# Patient Record
Sex: Male | Born: 1960 | Race: Black or African American | Hispanic: No | Marital: Married | Smoking: Former smoker
Health system: Southern US, Community
[De-identification: ages and names within clinical notes are randomized; demographics above are authoritative.]

## PROBLEM LIST (undated history)

## (undated) DIAGNOSIS — R972 Elevated prostate specific antigen [PSA]: Secondary | ICD-10-CM

## (undated) DIAGNOSIS — N5201 Erectile dysfunction due to arterial insufficiency: Secondary | ICD-10-CM

## (undated) DIAGNOSIS — M7581 Other shoulder lesions, right shoulder: Secondary | ICD-10-CM

## (undated) DIAGNOSIS — E7800 Pure hypercholesterolemia, unspecified: Secondary | ICD-10-CM

## (undated) DIAGNOSIS — M1990 Unspecified osteoarthritis, unspecified site: Secondary | ICD-10-CM

## (undated) DIAGNOSIS — M2042 Other hammer toe(s) (acquired), left foot: Secondary | ICD-10-CM

## (undated) DIAGNOSIS — Z87442 Personal history of urinary calculi: Secondary | ICD-10-CM

## (undated) DIAGNOSIS — I10 Essential (primary) hypertension: Secondary | ICD-10-CM

## (undated) DIAGNOSIS — R29898 Other symptoms and signs involving the musculoskeletal system: Secondary | ICD-10-CM

## (undated) DIAGNOSIS — M2012 Hallux valgus (acquired), left foot: Secondary | ICD-10-CM

## (undated) HISTORY — PX: HERNIA REPAIR: SHX51

---

## 2006-01-03 ENCOUNTER — Ambulatory Visit: Payer: Self-pay

## 2006-02-07 ENCOUNTER — Ambulatory Visit: Payer: Self-pay | Admitting: Pain Medicine

## 2006-02-13 ENCOUNTER — Ambulatory Visit: Payer: Self-pay | Admitting: Pain Medicine

## 2006-03-23 ENCOUNTER — Ambulatory Visit: Payer: Self-pay | Admitting: Pain Medicine

## 2006-03-27 ENCOUNTER — Ambulatory Visit: Payer: Self-pay | Admitting: Pain Medicine

## 2006-04-15 ENCOUNTER — Ambulatory Visit: Payer: Self-pay | Admitting: Pain Medicine

## 2006-05-01 ENCOUNTER — Ambulatory Visit: Payer: Self-pay | Admitting: Pain Medicine

## 2006-06-08 ENCOUNTER — Ambulatory Visit: Payer: Self-pay | Admitting: Pain Medicine

## 2006-06-12 ENCOUNTER — Ambulatory Visit: Payer: Self-pay | Admitting: Pain Medicine

## 2006-07-27 ENCOUNTER — Ambulatory Visit: Payer: Self-pay | Admitting: Pain Medicine

## 2006-07-31 ENCOUNTER — Ambulatory Visit: Payer: Self-pay | Admitting: Pain Medicine

## 2006-09-07 ENCOUNTER — Ambulatory Visit: Payer: Self-pay | Admitting: Pain Medicine

## 2006-12-12 ENCOUNTER — Ambulatory Visit: Payer: Self-pay | Admitting: Pain Medicine

## 2007-03-20 ENCOUNTER — Ambulatory Visit: Payer: Self-pay | Admitting: Pain Medicine

## 2007-03-26 ENCOUNTER — Ambulatory Visit: Payer: Self-pay | Admitting: Pain Medicine

## 2007-04-10 ENCOUNTER — Ambulatory Visit: Payer: Self-pay | Admitting: Pain Medicine

## 2007-05-31 ENCOUNTER — Ambulatory Visit: Payer: Self-pay | Admitting: Pain Medicine

## 2007-06-26 ENCOUNTER — Ambulatory Visit: Payer: Self-pay | Admitting: Pain Medicine

## 2007-08-24 ENCOUNTER — Ambulatory Visit: Payer: Self-pay | Admitting: Pain Medicine

## 2007-10-18 ENCOUNTER — Ambulatory Visit: Payer: Self-pay | Admitting: Pain Medicine

## 2008-01-08 ENCOUNTER — Ambulatory Visit: Payer: Self-pay | Admitting: Pain Medicine

## 2008-04-01 ENCOUNTER — Ambulatory Visit: Payer: Self-pay | Admitting: Pain Medicine

## 2008-06-24 ENCOUNTER — Ambulatory Visit: Payer: Self-pay | Admitting: Pain Medicine

## 2008-07-29 ENCOUNTER — Ambulatory Visit: Payer: Self-pay | Admitting: Surgery

## 2008-08-04 ENCOUNTER — Ambulatory Visit: Payer: Self-pay | Admitting: Surgery

## 2008-08-14 ENCOUNTER — Ambulatory Visit: Payer: Self-pay | Admitting: Pain Medicine

## 2011-10-10 ENCOUNTER — Ambulatory Visit: Payer: Self-pay | Admitting: Gastroenterology

## 2012-07-10 DIAGNOSIS — N5201 Erectile dysfunction due to arterial insufficiency: Secondary | ICD-10-CM | POA: Insufficient documentation

## 2012-07-10 DIAGNOSIS — N400 Enlarged prostate without lower urinary tract symptoms: Secondary | ICD-10-CM | POA: Insufficient documentation

## 2012-07-10 DIAGNOSIS — Z79899 Other long term (current) drug therapy: Secondary | ICD-10-CM | POA: Insufficient documentation

## 2012-09-18 ENCOUNTER — Ambulatory Visit: Payer: Self-pay | Admitting: Gastroenterology

## 2012-10-08 ENCOUNTER — Ambulatory Visit: Payer: Self-pay | Admitting: Gastroenterology

## 2013-03-29 ENCOUNTER — Ambulatory Visit: Payer: Self-pay

## 2015-06-12 DIAGNOSIS — R739 Hyperglycemia, unspecified: Secondary | ICD-10-CM | POA: Insufficient documentation

## 2015-06-12 DIAGNOSIS — H8110 Benign paroxysmal vertigo, unspecified ear: Secondary | ICD-10-CM | POA: Insufficient documentation

## 2016-08-30 DIAGNOSIS — M25511 Pain in right shoulder: Secondary | ICD-10-CM | POA: Insufficient documentation

## 2017-04-19 DIAGNOSIS — M25511 Pain in right shoulder: Secondary | ICD-10-CM | POA: Diagnosis not present

## 2017-04-19 DIAGNOSIS — G8929 Other chronic pain: Secondary | ICD-10-CM | POA: Diagnosis not present

## 2017-04-19 DIAGNOSIS — M7581 Other shoulder lesions, right shoulder: Secondary | ICD-10-CM | POA: Diagnosis not present

## 2017-04-20 ENCOUNTER — Other Ambulatory Visit: Payer: Self-pay | Admitting: Student

## 2017-04-20 DIAGNOSIS — M25511 Pain in right shoulder: Secondary | ICD-10-CM

## 2017-04-20 DIAGNOSIS — M7581 Other shoulder lesions, right shoulder: Secondary | ICD-10-CM

## 2017-04-20 DIAGNOSIS — G8929 Other chronic pain: Secondary | ICD-10-CM

## 2017-06-12 DIAGNOSIS — N400 Enlarged prostate without lower urinary tract symptoms: Secondary | ICD-10-CM | POA: Diagnosis not present

## 2017-06-12 DIAGNOSIS — E291 Testicular hypofunction: Secondary | ICD-10-CM | POA: Diagnosis not present

## 2017-06-23 DIAGNOSIS — Z7689 Persons encountering health services in other specified circumstances: Secondary | ICD-10-CM | POA: Diagnosis not present

## 2017-07-03 DIAGNOSIS — M25511 Pain in right shoulder: Secondary | ICD-10-CM | POA: Diagnosis not present

## 2017-07-03 DIAGNOSIS — Z0000 Encounter for general adult medical examination without abnormal findings: Secondary | ICD-10-CM | POA: Diagnosis not present

## 2017-07-03 DIAGNOSIS — I10 Essential (primary) hypertension: Secondary | ICD-10-CM | POA: Diagnosis not present

## 2017-09-25 DIAGNOSIS — R350 Frequency of micturition: Secondary | ICD-10-CM | POA: Diagnosis not present

## 2017-09-25 DIAGNOSIS — N342 Other urethritis: Secondary | ICD-10-CM | POA: Diagnosis not present

## 2017-10-27 DIAGNOSIS — G8929 Other chronic pain: Secondary | ICD-10-CM | POA: Diagnosis not present

## 2017-10-27 DIAGNOSIS — M7581 Other shoulder lesions, right shoulder: Secondary | ICD-10-CM | POA: Diagnosis not present

## 2017-10-27 DIAGNOSIS — M25511 Pain in right shoulder: Secondary | ICD-10-CM | POA: Diagnosis not present

## 2017-10-31 ENCOUNTER — Other Ambulatory Visit: Payer: Self-pay | Admitting: Student

## 2017-10-31 DIAGNOSIS — G8929 Other chronic pain: Secondary | ICD-10-CM

## 2017-10-31 DIAGNOSIS — M25511 Pain in right shoulder: Secondary | ICD-10-CM

## 2017-10-31 DIAGNOSIS — M7581 Other shoulder lesions, right shoulder: Secondary | ICD-10-CM

## 2017-11-11 ENCOUNTER — Ambulatory Visit
Admission: RE | Admit: 2017-11-11 | Discharge: 2017-11-11 | Disposition: A | Discharge status: Home / Self Care | Payer: 59 | Source: Ambulatory Visit | Attending: Student | Admitting: Student

## 2017-11-11 DIAGNOSIS — M75121 Complete rotator cuff tear or rupture of right shoulder, not specified as traumatic: Secondary | ICD-10-CM | POA: Insufficient documentation

## 2017-11-11 DIAGNOSIS — G8929 Other chronic pain: Secondary | ICD-10-CM | POA: Diagnosis not present

## 2017-11-11 DIAGNOSIS — M25511 Pain in right shoulder: Secondary | ICD-10-CM | POA: Diagnosis present

## 2017-11-11 DIAGNOSIS — M25411 Effusion, right shoulder: Secondary | ICD-10-CM | POA: Diagnosis not present

## 2017-11-11 DIAGNOSIS — M7581 Other shoulder lesions, right shoulder: Secondary | ICD-10-CM | POA: Diagnosis not present

## 2017-12-04 DIAGNOSIS — M75121 Complete rotator cuff tear or rupture of right shoulder, not specified as traumatic: Secondary | ICD-10-CM | POA: Insufficient documentation

## 2017-12-04 DIAGNOSIS — M7581 Other shoulder lesions, right shoulder: Secondary | ICD-10-CM | POA: Diagnosis not present

## 2017-12-11 ENCOUNTER — Inpatient Hospital Stay: Admission: RE | Admit: 2017-12-11 | Payer: 59 | Source: Ambulatory Visit

## 2017-12-13 ENCOUNTER — Encounter
Admission: RE | Admit: 2017-12-13 | Discharge: 2017-12-13 | Disposition: A | Discharge status: Home / Self Care | Payer: 59 | Source: Ambulatory Visit | Attending: Surgery | Admitting: Surgery

## 2017-12-13 ENCOUNTER — Other Ambulatory Visit: Payer: Self-pay

## 2017-12-13 HISTORY — DX: Unspecified osteoarthritis, unspecified site: M19.90

## 2017-12-13 NOTE — Patient Instructions (Signed)
  Your procedure is scheduled on: 12-19-17 Report to Same Day Surgery 2nd floor medical mall [facility] Entrance-take elevator on left to 2nd floor.  Check in with surgery information desk.) To find out your arrival time please call [phone] between 1PM - 3PM on 12-18-17  Remember: Instructions that are not followed completely may result in serious medical risk, up to and including death, or upon the discretion of your surgeon and anesthesiologist your surgery may need to be rescheduled.    _x___ 1. Do not eat food after midnight the night before your procedure. NO GUM OR CANDY AFTER MIDNIGHT.  You may drink clear liquids up to 2 hours before you are scheduled to arrive at the hospital for your procedure.  Do not drink clear liquids within 2 hours of your scheduled arrival to the hospital.  Clear liquids include  --Water or Apple juice without pulp  --Clear carbohydrate beverage such as ClearFast or Gatorade  --Black Coffee or Clear Tea (No milk, no creamers, do not add anything to  the coffee or Tea    __x__ 2. No Alcohol for 24 hours before or after surgery.   __x__3. No Smoking for 24 prior to surgery.   ____  4. Bring all medications with you on the day of surgery if instructed.    __x__ 5. Notify your doctor if there is any change in your medical condition     (cold, fever, infections).     Do not wear jewelry, make-up, hairpins, clips or nail polish.  Do not wear lotions, powders, or perfumes. You may wear deodorant.  Do not shave 48 hours prior to surgery. Men may shave face and neck.  Do not bring valuables to the hospital.    [facility] is not responsible for any belongings or valuables.               Contacts, dentures or bridgework may not be worn into surgery.  Leave your suitcase in the car. After surgery it may be brought to your room.  For patients admitted to the hospital, discharge time is determined by your treatment team.   Patients discharged the day of  surgery will not be allowed to drive home.  You will need someone to drive you home and stay with you the night of your procedure.    Please read over the following fact sheets that you were given:   [facility] Preparing for Surgery and or MRSA Information   _x___ TAKE THE FOLLOWING MEDICATIONS THE MORNING OF SURGERY WITH A SMALL SIP OF WATER.    NONE   ____Fleets enema or Magnesium Citrate as directed.   ____ Use CHG Soap or sage wipes as directed on instruction sheet   ____ Use inhalers on the day of surgery and bring to hospital day of surgery  ____ Stop Metformin and Janumet 2 days prior to surgery.    ____ Take 1/2 of usual insulin dose the night before surgery and none on the morning  surgery.   ____ Follow recommendations from Cardiologist, Pulmonologist or PCP regarding stopping Aspirin, Coumadin, Plavix ,Eliquis, Effient, or Pradaxa, and Pletal.  X____Stop Anti-inflammatories such as Advil, Aleve, IBUPROFEN PM, Motrin, Naproxen, Naprosyn, Goodies powders or aspirin products NOW-OK to take Tylenol    _x___ Stop supplements until after surgery-STOP FISH OIL AND VITAMIN E NOW-MAY RESUME AFTER SURGERY   ____ Bring C-Pap to the hospital.

## 2017-12-18 MED ORDER — CEFAZOLIN SODIUM-DEXTROSE 2-4 GM/100ML-% IV SOLN
2.0000 g | Freq: Once | INTRAVENOUS | Status: AC
  Administered 2017-12-19: 2 g via INTRAVENOUS

## 2017-12-19 ENCOUNTER — Ambulatory Visit
Admission: RE | Admit: 2017-12-19 | Discharge: 2017-12-19 | Disposition: A | Discharge status: Home / Self Care | Payer: 59 | Source: Ambulatory Visit | Attending: Surgery | Admitting: Surgery

## 2017-12-19 ENCOUNTER — Ambulatory Visit: Payer: 59 | Admitting: Anesthesiology

## 2017-12-19 ENCOUNTER — Other Ambulatory Visit: Payer: Self-pay

## 2017-12-19 ENCOUNTER — Encounter
Admission: RE | Disposition: A | Discharge status: Home / Self Care | Payer: Self-pay | Source: Ambulatory Visit | Attending: Surgery

## 2017-12-19 DIAGNOSIS — I10 Essential (primary) hypertension: Secondary | ICD-10-CM | POA: Diagnosis not present

## 2017-12-19 DIAGNOSIS — Z803 Family history of malignant neoplasm of breast: Secondary | ICD-10-CM | POA: Diagnosis not present

## 2017-12-19 DIAGNOSIS — E785 Hyperlipidemia, unspecified: Secondary | ICD-10-CM | POA: Diagnosis not present

## 2017-12-19 DIAGNOSIS — Z823 Family history of stroke: Secondary | ICD-10-CM | POA: Diagnosis not present

## 2017-12-19 DIAGNOSIS — M75101 Unspecified rotator cuff tear or rupture of right shoulder, not specified as traumatic: Secondary | ICD-10-CM | POA: Insufficient documentation

## 2017-12-19 DIAGNOSIS — M7541 Impingement syndrome of right shoulder: Secondary | ICD-10-CM | POA: Insufficient documentation

## 2017-12-19 DIAGNOSIS — M75121 Complete rotator cuff tear or rupture of right shoulder, not specified as traumatic: Secondary | ICD-10-CM | POA: Diagnosis not present

## 2017-12-19 DIAGNOSIS — M25511 Pain in right shoulder: Secondary | ICD-10-CM | POA: Diagnosis not present

## 2017-12-19 DIAGNOSIS — M7581 Other shoulder lesions, right shoulder: Secondary | ICD-10-CM | POA: Diagnosis not present

## 2017-12-19 DIAGNOSIS — Z87891 Personal history of nicotine dependence: Secondary | ICD-10-CM | POA: Diagnosis not present

## 2017-12-19 DIAGNOSIS — G8918 Other acute postprocedural pain: Secondary | ICD-10-CM | POA: Diagnosis not present

## 2017-12-19 HISTORY — PX: SHOULDER ARTHROSCOPY WITH OPEN ROTATOR CUFF REPAIR: SHX6092

## 2017-12-19 SURGERY — ARTHROSCOPY, SHOULDER WITH REPAIR, ROTATOR CUFF, OPEN
Anesthesia: General | Site: Shoulder | Laterality: Right

## 2017-12-19 MED ORDER — FAMOTIDINE 20 MG PO TABS
ORAL_TABLET | ORAL | Status: AC
  Administered 2017-12-19: 20 mg via ORAL
  Filled 2017-12-19: qty 1

## 2017-12-19 MED ORDER — ONDANSETRON HCL 4 MG/2ML IJ SOLN: 4.0000 mg | Freq: Once | INTRAMUSCULAR | Status: DC | PRN

## 2017-12-19 MED ORDER — FENTANYL CITRATE (PF) 100 MCG/2ML IJ SOLN
INTRAMUSCULAR | Status: DC | PRN
  Administered 2017-12-19: 50 ug via INTRAVENOUS

## 2017-12-19 MED ORDER — BUPIVACAINE LIPOSOME 1.3 % IJ SUSP
INTRAMUSCULAR | Status: AC
  Filled 2017-12-19: qty 20

## 2017-12-19 MED ORDER — LACTATED RINGERS IV SOLN
INTRAVENOUS | Status: DC
Start: 2017-12-19 — End: 2017-12-20
  Administered 2017-12-19: 11:00:00 via INTRAVENOUS

## 2017-12-19 MED ORDER — LIDOCAINE HCL (CARDIAC) 20 MG/ML IV SOLN
INTRAVENOUS | Status: DC | PRN
  Administered 2017-12-19: 100 mg via INTRAVENOUS

## 2017-12-19 MED ORDER — ROCURONIUM BROMIDE 100 MG/10ML IV SOLN
INTRAVENOUS | Status: DC | PRN
  Administered 2017-12-19: 80 mg via INTRAVENOUS

## 2017-12-19 MED ORDER — SUGAMMADEX SODIUM 200 MG/2ML IV SOLN
INTRAVENOUS | Status: DC | PRN
  Administered 2017-12-19: 170 mg via INTRAVENOUS

## 2017-12-19 MED ORDER — BUPIVACAINE HCL (PF) 0.5 % IJ SOLN
INTRAMUSCULAR | Status: AC
  Filled 2017-12-19: qty 10

## 2017-12-19 MED ORDER — LIDOCAINE HCL (PF) 2 % IJ SOLN
INTRAMUSCULAR | Status: AC
  Filled 2017-12-19: qty 10

## 2017-12-19 MED ORDER — FENTANYL CITRATE (PF) 100 MCG/2ML IJ SOLN: 25.0000 ug | INTRAMUSCULAR | Status: DC | PRN

## 2017-12-19 MED ORDER — MIDAZOLAM HCL 2 MG/2ML IJ SOLN
2.0000 mg | Freq: Once | INTRAMUSCULAR | Status: AC
  Administered 2017-12-19: 2 mg via INTRAVENOUS

## 2017-12-19 MED ORDER — OXYCODONE HCL 5 MG PO TABS: 5.0000 mg | ORAL_TABLET | ORAL | 0 refills | Status: DC | PRN

## 2017-12-19 MED ORDER — ROCURONIUM BROMIDE 50 MG/5ML IV SOLN
INTRAVENOUS | Status: AC
  Filled 2017-12-19: qty 2

## 2017-12-19 MED ORDER — DEXAMETHASONE SODIUM PHOSPHATE 4 MG/ML IJ SOLN
INTRAMUSCULAR | Status: DC | PRN
  Administered 2017-12-19: 6 mg via INTRAVENOUS

## 2017-12-19 MED ORDER — PROPOFOL 10 MG/ML IV BOLUS
INTRAVENOUS | Status: DC | PRN
  Administered 2017-12-19: 100 mg via INTRAVENOUS
  Administered 2017-12-19: 200 mg via INTRAVENOUS

## 2017-12-19 MED ORDER — BUPIVACAINE-EPINEPHRINE 0.5% -1:200000 IJ SOLN
INTRAMUSCULAR | Status: DC | PRN
  Administered 2017-12-19: 30 mL

## 2017-12-19 MED ORDER — EPINEPHRINE PF 1 MG/ML IJ SOLN
INTRAMUSCULAR | Status: AC
  Filled 2017-12-19: qty 2

## 2017-12-19 MED ORDER — LIDOCAINE HCL (PF) 1 % IJ SOLN
INTRAMUSCULAR | Status: DC | PRN
  Administered 2017-12-19: 3 mL via SUBCUTANEOUS

## 2017-12-19 MED ORDER — BUPIVACAINE LIPOSOME 1.3 % IJ SUSP
INTRAMUSCULAR | Status: DC | PRN
  Administered 2017-12-19: 20 mL via PERINEURAL

## 2017-12-19 MED ORDER — FAMOTIDINE 20 MG PO TABS
20.0000 mg | ORAL_TABLET | Freq: Once | ORAL | Status: AC
  Administered 2017-12-19: 20 mg via ORAL

## 2017-12-19 MED ORDER — BUPIVACAINE HCL (PF) 0.5 % IJ SOLN
INTRAMUSCULAR | Status: DC | PRN
Start: 2017-12-19 — End: 2017-12-19
  Administered 2017-12-19: 10 mL via PERINEURAL

## 2017-12-19 MED ORDER — BUPIVACAINE-EPINEPHRINE (PF) 0.5% -1:200000 IJ SOLN
INTRAMUSCULAR | Status: AC
  Filled 2017-12-19: qty 30

## 2017-12-19 MED ORDER — LIDOCAINE HCL (PF) 1 % IJ SOLN
INTRAMUSCULAR | Status: AC
  Filled 2017-12-19: qty 5

## 2017-12-19 MED ORDER — MIDAZOLAM HCL 2 MG/2ML IJ SOLN
INTRAMUSCULAR | Status: AC
  Administered 2017-12-19: 2 mg via INTRAVENOUS
  Filled 2017-12-19: qty 2

## 2017-12-19 MED ORDER — CEFAZOLIN SODIUM-DEXTROSE 2-4 GM/100ML-% IV SOLN
INTRAVENOUS | Status: AC
  Filled 2017-12-19: qty 100

## 2017-12-19 MED ORDER — FENTANYL CITRATE (PF) 250 MCG/5ML IJ SOLN
INTRAMUSCULAR | Status: AC
  Filled 2017-12-19: qty 5

## 2017-12-19 MED ORDER — MIDAZOLAM HCL 2 MG/2ML IJ SOLN
INTRAMUSCULAR | Status: AC
  Administered 2017-12-19: 1 mg via INTRAVENOUS
  Filled 2017-12-19: qty 2

## 2017-12-19 MED ORDER — ONDANSETRON HCL 4 MG/2ML IJ SOLN
INTRAMUSCULAR | Status: DC | PRN
  Administered 2017-12-19: 4 mg via INTRAVENOUS

## 2017-12-19 MED ORDER — MIDAZOLAM HCL 2 MG/2ML IJ SOLN
1.0000 mg | Freq: Once | INTRAMUSCULAR | Status: AC
  Administered 2017-12-19: 1 mg via INTRAVENOUS

## 2017-12-19 MED ORDER — PROPOFOL 10 MG/ML IV BOLUS
INTRAVENOUS | Status: AC
  Filled 2017-12-19: qty 20

## 2017-12-19 SURGICAL SUPPLY — 42 items
ANCHOR JUGGERKNOT WTAP NDL 2.9 (Anchor) ×6 IMPLANT
ANCHOR SUT QUATTRO KNTLS 4.5 (Anchor) ×6 IMPLANT
BIT DRILL JUGRKNT W/NDL BIT2.9 (DRILL) ×1 IMPLANT
BLADE FULL RADIUS 3.5 (BLADE) ×3 IMPLANT
BUR ACROMIONIZER 4.0 (BURR) ×3 IMPLANT
CANNULA SHAVER 8MMX76MM (CANNULA) ×3 IMPLANT
CHLORAPREP W/TINT 26ML (MISCELLANEOUS) ×3 IMPLANT
COVER MAYO STAND STRL (DRAPES) ×3 IMPLANT
DRAPE IMP U-DRAPE 54X76 (DRAPES) ×6 IMPLANT
DRILL JUGGERKNOT W/NDL BIT 2.9 (DRILL) ×3
ELECT REM PT RETURN 9FT ADLT (ELECTROSURGICAL) ×3
ELECTRODE REM PT RTRN 9FT ADLT (ELECTROSURGICAL) ×1 IMPLANT
GAUZE PETRO XEROFOAM 1X8 (MISCELLANEOUS) ×3 IMPLANT
GAUZE SPONGE 4X4 12PLY STRL (GAUZE/BANDAGES/DRESSINGS) ×3 IMPLANT
GLOVE BIO SURGEON STRL SZ7.5 (GLOVE) ×6 IMPLANT
GLOVE BIO SURGEON STRL SZ8 (GLOVE) ×6 IMPLANT
GLOVE BIOGEL PI IND STRL 8 (GLOVE) ×1 IMPLANT
GLOVE BIOGEL PI INDICATOR 8 (GLOVE) ×2
GLOVE INDICATOR 8.0 STRL GRN (GLOVE) ×3 IMPLANT
GOWN STRL REUS W/ TWL LRG LVL3 (GOWN DISPOSABLE) ×1 IMPLANT
GOWN STRL REUS W/ TWL XL LVL3 (GOWN DISPOSABLE) ×1 IMPLANT
GOWN STRL REUS W/TWL LRG LVL3 (GOWN DISPOSABLE) ×3
GOWN STRL REUS W/TWL XL LVL3 (GOWN DISPOSABLE) ×3
GRASPER SUT 15 45D LOW PRO (SUTURE) IMPLANT
IV LACTATED RINGER IRRG 3000ML (IV SOLUTION) ×6
IV LR IRRIG 3000ML ARTHROMATIC (IV SOLUTION) ×2 IMPLANT
MANIFOLD NEPTUNE II (INSTRUMENTS) ×3 IMPLANT
MASK FACE SPIDER DISP (MASK) ×3 IMPLANT
MAT BLUE FLOOR 46X72 FLO (MISCELLANEOUS) ×3 IMPLANT
PACK ARTHROSCOPY SHOULDER (MISCELLANEOUS) ×3 IMPLANT
SLING ARM LRG DEEP (SOFTGOODS) ×3 IMPLANT
SLING ULTRA II LG (MISCELLANEOUS) ×3 IMPLANT
STAPLER SKIN PROX 35W (STAPLE) ×3 IMPLANT
STRAP SAFETY 5IN WIDE (MISCELLANEOUS) ×3 IMPLANT
SUT ETHIBOND 0 MO6 C/R (SUTURE) ×3 IMPLANT
SUT VIC AB 2-0 CT1 27 (SUTURE) ×6
SUT VIC AB 2-0 CT1 TAPERPNT 27 (SUTURE) ×2 IMPLANT
TAPE MICROFOAM 4IN (TAPE) ×3 IMPLANT
TUBING ARTHRO INFLOW-ONLY STRL (TUBING) ×3 IMPLANT
TUBING CONNECTING 10 (TUBING) ×2 IMPLANT
TUBING CONNECTING 10' (TUBING) ×1
WAND HAND CNTRL MULTIVAC 90 (MISCELLANEOUS) ×3 IMPLANT

## 2017-12-19 NOTE — H&P (Signed)
Paper H&P to be scanned into permanent record. H&P reviewed and patient re-examined. No changes. 

## 2017-12-19 NOTE — Progress Notes (Signed)
Ice pack to right shoulder.

## 2017-12-19 NOTE — Anesthesia Preprocedure Evaluation (Signed)
Anesthesia Evaluation  Patient identified by MRN, date of birth, ID band Patient awake    Reviewed: Allergy & Precautions, H&P , NPO status , Patient's Chart, lab work & pertinent test results, reviewed documented beta blocker date and time   Airway Mallampati: II  TM Distance: >3 FB Neck ROM: full    Dental  (+) Teeth Intact   Pulmonary neg pulmonary ROS, former smoker,    Pulmonary exam normal        Cardiovascular negative cardio ROS Normal cardiovascular exam Rhythm:regular Rate:Normal     Neuro/Psych negative neurological ROS  negative psych ROS   GI/Hepatic negative GI ROS, Neg liver ROS,   Endo/Other  negative endocrine ROS  Renal/GU negative Renal ROS  negative genitourinary   Musculoskeletal   Abdominal   Peds  Hematology negative hematology ROS (+)   Anesthesia Other Findings Past Medical History: No date: Arthritis     Comment:  big toe Past Surgical History: No date: HERNIA REPAIR     Comment:  umbilical BMI    Body Mass Index:  32.72 kg/m     Reproductive/Obstetrics negative OB ROS                             Anesthesia Physical Anesthesia Plan  ASA: II  Anesthesia Plan: General ETT   Post-op Pain Management:  Regional for Post-op pain   Induction:   PONV Risk Score and Plan: 3  Airway Management Planned:   Additional Equipment:   Intra-op Plan:   Post-operative Plan:   Informed Consent: I have reviewed the patients History and Physical, chart, labs and discussed the procedure including the risks, benefits and alternatives for the proposed anesthesia with the patient or authorized representative who has indicated his/her understanding and acceptance.   Dental Advisory Given  Plan Discussed with: CRNA  Anesthesia Plan Comments:         Anesthesia Quick Evaluation

## 2017-12-19 NOTE — Discharge Instructions (Addendum)
Keep dressing dry and intact.  May shower after dressing changed on post-op day #4 (Saturday).  Cover staples with Band-Aids after drying off. Apply ice frequently to shoulder. Take ibuprofen 800 mg TID with meals for 7-10 days, then as necessary. Take oxycodone as prescribed when needed.  May supplement with ES Tylenol if necessary. Keep shoulder immobilizer on at all times except may remove for bathing purposes. Follow-up in 10-14 days or as scheduled.      Interscalene Nerve Block with Exparel  1.  For your surgery you have received an Interscalene Nerve Block with Exparel. 2. Nerve Blocks affect many types of nerves, including nerves that control movement, pain and normal sensation.  You may experience feelings such as numbness, tingling, heaviness, weakness or the inability to move your arm or the feeling or sensation that your arm has "fallen asleep". 3. A nerve block with Exparel can last up to 5 days.  Usually the weakness wears off first.  The tingling and heaviness usually wear off next.  Finally you may start to notice pain.  Keep in mind that this may occur in any order.  Once a nerve block starts to wear off it is usually completely gone within 60 minutes. 4. ISNB may cause mild shortness of breath, a hoarse voice, blurry vision, unequal pupils, or drooping of the face on the same side as the nerve block.  These symptoms will usually resolve with the numbness.  Very rarely the procedure itself can cause mild seizures. 5. If needed, your surgeon will give you a prescription for pain medication.  It will take about 60 minutes for the oral pain medication to become fully effective.  So, it is recommended that you start taking this medication before the nerve block first begins to wear off, or when you first begin to feel discomfort. 6. Take your pain medication only as prescribed.  Pain medication can cause sedation and decrease your breathing if you take more than you need for the level  of pain that you have. 7. Nausea is a common side effect of many pain medications.  You may want to eat something before taking your pain medicine to prevent nausea. 8. After an Interscalene nerve block, you cannot feel pain, pressure or extremes in temperature in the effected arm.  Because your arm is numb it is at an increased risk for injury.  To decrease the possibility of injury, please practice the following:  a. While you are awake change the position of your arm frequently to prevent too much pressure on any one area for prolonged periods of time. b.  If you have a cast or tight dressing, check the color or your fingers every couple of hours.  Call your surgeon with the appearance of any discoloration (white or blue). c. If you are given a sling to wear before you go home, please wear it  at all times until the block has completely worn off.  Do not get up at night without your sling. d. Please contact [facility] or your surgeon if you do not begin to regain sensation after 7 days from the surgery.  Anesthesia may be contacted by calling the Same Day Surgery Department, Mon. through Fri., 6 am to 4 pm at [phone].   e. If you experience any other problems or concerns, please contact your surgeon's office. If you experience severe or prolonged shortness of breath go to the nearest emergency department.   Interscalene Nerve Block with Exparel  For your surgery you have received an Interscalene Nerve Block with Exparel. Nerve Blocks affect many types of nerves, including nerves that control movement, pain and normal sensation.  You may experience feelings such as numbness, tingling, heaviness, weakness or the inability to move your arm or the feeling or sensation that your arm has "fallen asleep". A nerve block with Exparel can last up to 5 days.  Usually the weakness wears off first.  The tingling and heaviness usually wear off next.  Finally you may start to notice pain.  Keep in mind  that this may occur in any order.  Once a nerve block starts to wear off it is usually completely gone within 60 minutes. ISNB may cause mild shortness of breath, a hoarse voice, blurry vision, unequal pupils, or drooping of the face on the same side as the nerve block.  These symptoms will usually resolve with the numbness.  Very rarely the procedure itself can cause mild seizures. If needed, your surgeon will give you a prescription for pain medication.  It will take about 60 minutes for the oral pain medication to become fully effective.  So, it is recommended that you start taking this medication before the nerve block first begins to wear off, or when you first begin to feel discomfort. Take your pain medication only as prescribed.  Pain medication can cause sedation and decrease your breathing if you take more than you need for the level of pain that you have. Nausea is a common side effect of many pain medications.  You may want to eat something before taking your pain medicine to prevent nausea. After an Interscalene nerve block, you cannot feel pain, pressure or extremes in temperature in the effected arm.  Because your arm is numb it is at an increased risk for injury.  To decrease the possibility of injury, please practice the following:  While you are awake change the position of your arm frequently to prevent too much pressure on any one area for prolonged periods of time.  If you have a cast or tight dressing, check the color or your fingers every couple of hours.  Call your surgeon with the appearance of any discoloration (white or blue). If you are given a sling to wear before you go home, please wear it  at all times until the block has completely worn off.  Do not get up at night without your sling. Please contact [facility] or your surgeon if you do not begin to regain sensation after 7 days from the surgery.  Anesthesia may be contacted by calling the Same Day Surgery Department,  Mon. through Fri., 6 am to 4 pm at [phone].   If you experience any other problems or concerns, please contact your surgeon's office. If you experience severe or prolonged shortness of breath go to the nearest emergency department.   Interscalene Nerve Block with Exparel   For your surgery you have received an Interscalene Nerve Block with Exparel. Nerve Blocks affect many types of nerves, including nerves that control movement, pain and normal sensation.  You may experience feelings such as numbness, tingling, heaviness, weakness or the inability to move your arm or the feeling or sensation that your arm has "fallen asleep". A nerve block with Exparel can last up to 5 days.  Usually the weakness wears off first.  The tingling and heaviness usually wear off next.  Finally you may start to notice pain.  Keep in mind that this may occur  in any order.  Once a nerve block starts to wear off it is usually completely gone within 60 minutes. ISNB may cause mild shortness of breath, a hoarse voice, blurry vision, unequal pupils, or drooping of the face on the same side as the nerve block.  These symptoms will usually resolve with the numbness.  Very rarely the procedure itself can cause mild seizures. If needed, your surgeon will give you a prescription for pain medication.  It will take about 60 minutes for the oral pain medication to become fully effective.  So, it is recommended that you start taking this medication before the nerve block first begins to wear off, or when you first begin to feel discomfort. Take your pain medication only as prescribed.  Pain medication can cause sedation and decrease your breathing if you take more than you need for the level of pain that you have. Nausea is a common side effect of many pain medications.  You may want to eat something before taking your pain medicine to prevent nausea. After an Interscalene nerve block, you cannot feel pain, pressure or extremes in  temperature in the effected arm.  Because your arm is numb it is at an increased risk for injury.  To decrease the possibility of injury, please practice the following:  While you are awake change the position of your arm frequently to prevent too much pressure on any one area for prolonged periods of time.  If you have a cast or tight dressing, check the color or your fingers every couple of hours.  Call your surgeon with the appearance of any discoloration (white or blue). If you are given a sling to wear before you go home, please wear it  at all times until the block has completely worn off.  Do not get up at night without your sling. Please contact [facility] or your surgeon if you do not begin to regain sensation after 7 days from the surgery.  Anesthesia may be contacted by calling the Same Day Surgery Department, Mon. through Fri., 6 am to 4 pm at [phone].   If you experience any other problems or concerns, please contact your surgeon's office. f. If you experience severe or prolonged shortness of breath go to the nearest emergency department.     AMBULATORY SURGERY  DISCHARGE INSTRUCTIONS   1) The drugs that you were given will stay in your system until tomorrow so for the next 24 hours you should not:  A) Drive an automobile B) Make any legal decisions C) Drink any alcoholic beverage   2) You may resume regular meals tomorrow.  Today it is better to start with liquids and gradually work up to solid foods.  You may eat anything you prefer, but it is better to start with liquids, then soup and crackers, and gradually work up to solid foods.   3) Please notify your doctor immediately if you have any unusual bleeding, trouble breathing, redness and pain at the surgery site, drainage, fever, or pain not relieved by medication.    4) Additional Instructions:        Please contact your physician with any problems or Same Day Surgery at [phone], Monday through  Friday 6 am to 4 pm, or [place] at [facility] number at [phone].AMBULATORY SURGERY  DISCHARGE INSTRUCTIONS   5) The drugs that you were given will stay in your system until tomorrow so for the next 24 hours you should not:  D) Drive an automobile  E) Make any legal decisions F) Drink any alcoholic beverage   6) You may resume regular meals tomorrow.  Today it is better to start with liquids and gradually work up to solid foods.  You may eat anything you prefer, but it is better to start with liquids, then soup and crackers, and gradually work up to solid foods.   7) Please notify your doctor immediately if you have any unusual bleeding, trouble breathing, redness and pain at the surgery site, drainage, fever, or pain not relieved by medication.    8) Additional Instructions:        Please contact your physician with any problems or Same Day Surgery at [phone], Monday through Friday 6 am to 4 pm, or [place] at [facility] number at [phone].

## 2017-12-19 NOTE — Progress Notes (Signed)
Can wiggle fingers on right hand   Fingers warm and dry to right hand  Circulation positive to right hand

## 2017-12-19 NOTE — Anesthesia Procedure Notes (Signed)
Anesthesia Regional Block: Interscalene brachial plexus block   Pre-Anesthetic Checklist: ,, timeout performed, Correct Patient, Correct Site, Correct Laterality, Correct Procedure, Correct Position, site marked, Risks and benefits discussed,  Surgical consent,  Pre-op evaluation,  At surgeon's request and post-op pain management  Laterality: Right  Prep: chloraprep       Needles:  Injection technique: Single-shot  Needle Type: Echogenic Stimulator Needle     Needle Length: 10cm  Needle Gauge: 20     Additional Needles:   Procedures:, nerve stimulator,,, ultrasound used (permanent image in chart),,,,   Nerve Stimulator or Paresthesia:  Response: biceps flexion,   Additional Responses:   Narrative:  Injection made incrementally with aspirations every 5 mL.  Performed by: Personally   Additional Notes: Functioning IV was confirmed and monitors were applied. . Sterile prep and drape,hand hygiene and sterile gloves were used.  Negative aspiration and negative test dose prior to incremental administration of local anesthetic. The patient tolerated the procedure well.        

## 2017-12-19 NOTE — Progress Notes (Signed)
Family states understanding of instructions as given

## 2017-12-19 NOTE — Progress Notes (Deleted)
Can wiggle fingers on left hand   Fingers warm and dry to touch  Circulation positive to left hand

## 2017-12-19 NOTE — Anesthesia Post-op Follow-up Note (Signed)
Anesthesia QCDR form completed.        

## 2017-12-19 NOTE — Op Note (Signed)
12/19/2017  3:14 PM  Patient:   Nathan Herman  Pre-Op Diagnosis:   Impingement/tendinopathy with rotator cuff tear, right shoulder.  Post-Op Diagnosis: Impingement/tendinopathy with rotator cuff tear, right shoulder.  Procedure: Limited arthroscopic debridement, arthroscopic subacromial decompression, and mini-open rotator cuff repair, right shoulder.  Anesthesia: General endotracheal with interscalene block placed preoperatively by the anesthesiologist.  Surgeon:   Pascal Lux, MD  Assistant:   Cameron Proud, PA-C; Clearnce Sorrel, PA-S  Findings: As above.  The labrum was in satisfactory condition, as was the biceps tendon.  There was a full-thickness tear involving the anterior and middle portions of the supraspinatus tendon measuring approximately 2 cm in anterior-posterior dimensions and 1.5 cm in medial-lateral dimensions.  The articular surfaces of the glenoid and humerus both were in excellent condition.  Complications: None  Fluids:   850 cc  Estimated blood loss: 10 cc  Tourniquet time: None  Drains: None  Closure: Staples   Brief clinical note: The patient is a 57 year old male with a history of right shoulder pain. The patient's symptoms have progressed despite medications, activity modification, etc. The patient's history and examination are consistent with impingement/tendinopathy with a rotator cuff tear. These findings were confirmed by MRI scan. The patient presents at this time for definitive management of these shoulder symptoms.  Procedure: The patient underwent placement of an interscalene block by the anesthesiologist in the preoperative holding area before being brought into the operating room and lain in the supine position. The patient then underwent general endotracheal intubation and anesthesia before being repositioned in the beach chair position using the beach chair positioner. The right shoulder and upper extremity were  prepped with ChloraPrep solution before being draped sterilely. Preoperative antibiotics were administered. A timeout was performed to confirm the proper surgical site before the expected portal sites and incision site were injected with 0.5% Sensorcaine with epinephrine. A posterior portal was created and the glenohumeral joint thoroughly inspected with the findings as described above. An anterior portal was created using an outside-in technique. The labrum and rotator cuff were further probed, again confirming the above-noted findings. Areas of synovitis were debrided back to stable margins using the full-radius resector. The probe was inserted and used to assess the labrum and the biceps tendon with the findings as described above. Finally, the ArthroCare wand was inserted and used to obtain hemostasis. The instruments were removed from the joint after suctioning the excess fluid.  The camera was repositioned through the posterior portal into the subacromial space. A separate lateral portal was created using an outside-in technique. The 3.5 mm full-radius resector was introduced and used to perform a subtotal bursectomy. The ArthroCare wand was then inserted and used to remove the periosteal tissue off the undersurface of the anterior third of the acromion as well as to recess the coracoacromial ligament from its attachment along the anterior and lateral margins of the acromion. The 4.0 mm acromionizing bur was introduced and used to complete the decompression by removing the undersurface of the anterior third of the acromion. The full radius resector was reintroduced to remove any residual bony debris before the ArthroCare wand was reintroduced to obtain hemostasis. The instruments were then removed from the subacromial space after suctioning the excess fluid.  An approximately 4-5 cm incision was made over the anterolateral aspect of the shoulder beginning at the anterolateral corner of the acromion and  extending distally in line with the bicipital groove. This incision was carried down through the subcutaneous tissues to expose  the deltoid fascia. The raphae between the anterior and middle thirds was identified and this plane developed to provide access into the subacromial space. Additional bursal tissues were debrided sharply using Metzenbaum scissors. The rotator cuff tear was readily identified. The margins were debrided sharply with a #15 blade and the exposed greater tuberosity roughened with a rongeur. The tear was repaired using two Biomet 2.9 mm JuggerKnot anchors. These sutures were then brought back laterally and secured using two [place] QuatroLink anchors to create a two-layer closure. An apparent watertight closure was obtained.  The wound was copiously irrigated with sterile saline solution before the deltoid raphae was reapproximated using 2-0 Vicryl interrupted sutures. The subcutaneous tissues were closed in two layers using 2-0 Vicryl interrupted sutures before the skin was closed using staples. The portal sites also were closed using staples. A sterile bulky dressing was applied to the shoulder before the arm was placed into a shoulder immobilizer. The patient was then awakened, extubated, and returned to the recovery room in satisfactory condition after tolerating the procedure well.

## 2017-12-19 NOTE — Transfer of Care (Signed)
Immediate Anesthesia Transfer of Care Note  Patient: Nathan Herman  Procedure(s) Performed: SHOULDER ARTHROSCOPY WITH OPEN ROTATOR CUFF REPAIR (Right Shoulder)  Patient Location: PACU  Anesthesia Type:General  Level of Consciousness: awake, alert  and oriented  Airway & Oxygen Therapy: Patient Spontanous Breathing and Patient connected to nasal cannula oxygen  Post-op Assessment: Report given to RN and Post -op Vital signs reviewed and stable  Post vital signs: Reviewed and stable  Last Vitals:  Vitals:   12/19/17 1253 12/19/17 1507  BP:  (!) 143/92  Pulse: 66 72  Resp: 18 13  Temp:  36.6 C  SpO2: 100% 100%    Last Pain:  Vitals:   12/19/17 1248  TempSrc:   PainSc: 0-No pain         Complications: No apparent anesthesia complications

## 2017-12-19 NOTE — Anesthesia Procedure Notes (Signed)
Procedure Name: Intubation Date/Time: 12/19/2017 1:20 PM Performed by: Bernardo Heater, CRNA Pre-anesthesia Checklist: Patient identified, Emergency Drugs available, Suction available and Patient being monitored Patient Re-evaluated:Patient Re-evaluated prior to induction Oxygen Delivery Method: Circle system utilized Preoxygenation: Pre-oxygenation with 100% oxygen Induction Type: IV induction Laryngoscope Size: Mac and 3 Grade View: Grade II Tube size: 7.0 mm Number of attempts: 1 Placement Confirmation: ETT inserted through vocal cords under direct vision,  positive ETCO2 and breath sounds checked- equal and bilateral Secured at: 23 cm Tube secured with: Tape Dental Injury: Teeth and Oropharynx as per pre-operative assessment  Difficulty Due To: Difficult Airway- due to limited oral opening Future Recommendations: Recommend- induction with short-acting agent, and alternative techniques readily available

## 2017-12-20 ENCOUNTER — Encounter: Payer: Self-pay | Admitting: Surgery

## 2017-12-21 NOTE — Anesthesia Postprocedure Evaluation (Signed)
Anesthesia Post Note  Patient: Nathan Herman  Procedure(s) Performed: SHOULDER ARTHROSCOPY WITH OPEN ROTATOR CUFF REPAIR (Right Shoulder)  Patient location during evaluation: PACU Anesthesia Type: General Level of consciousness: awake and alert Pain management: pain level controlled Vital Signs Assessment: post-procedure vital signs reviewed and stable Respiratory status: spontaneous breathing, nonlabored ventilation, respiratory function stable and patient connected to nasal cannula oxygen Cardiovascular status: blood pressure returned to baseline and stable Postop Assessment: no apparent nausea or vomiting Anesthetic complications: no     Last Vitals:  Vitals:   12/19/17 1539 12/19/17 1600  BP: 135/83 123/72  Pulse: (!) 57 (!) 58  Resp: 16   Temp: (!) 35.8 C   SpO2: 99% 100%    Last Pain:  Vitals:   12/20/17 0832  TempSrc:   PainSc: 0-No pain                 Molli Barrows

## 2017-12-25 DIAGNOSIS — M25511 Pain in right shoulder: Secondary | ICD-10-CM | POA: Diagnosis not present

## 2017-12-25 DIAGNOSIS — G8929 Other chronic pain: Secondary | ICD-10-CM | POA: Diagnosis not present

## 2018-01-02 DIAGNOSIS — M25511 Pain in right shoulder: Secondary | ICD-10-CM | POA: Diagnosis not present

## 2018-01-02 DIAGNOSIS — G8929 Other chronic pain: Secondary | ICD-10-CM | POA: Diagnosis not present

## 2018-01-09 DIAGNOSIS — M25511 Pain in right shoulder: Secondary | ICD-10-CM | POA: Diagnosis not present

## 2018-01-09 DIAGNOSIS — G8929 Other chronic pain: Secondary | ICD-10-CM | POA: Diagnosis not present

## 2018-01-16 DIAGNOSIS — M25511 Pain in right shoulder: Secondary | ICD-10-CM | POA: Diagnosis not present

## 2018-01-16 DIAGNOSIS — G8929 Other chronic pain: Secondary | ICD-10-CM | POA: Diagnosis not present

## 2018-01-23 DIAGNOSIS — G8929 Other chronic pain: Secondary | ICD-10-CM | POA: Diagnosis not present

## 2018-01-23 DIAGNOSIS — M25511 Pain in right shoulder: Secondary | ICD-10-CM | POA: Diagnosis not present

## 2018-01-30 DIAGNOSIS — G8929 Other chronic pain: Secondary | ICD-10-CM | POA: Diagnosis not present

## 2018-01-30 DIAGNOSIS — M25511 Pain in right shoulder: Secondary | ICD-10-CM | POA: Diagnosis not present

## 2018-02-06 DIAGNOSIS — G8929 Other chronic pain: Secondary | ICD-10-CM | POA: Diagnosis not present

## 2018-02-06 DIAGNOSIS — M25511 Pain in right shoulder: Secondary | ICD-10-CM | POA: Diagnosis not present

## 2018-02-08 DIAGNOSIS — G8929 Other chronic pain: Secondary | ICD-10-CM | POA: Diagnosis not present

## 2018-02-08 DIAGNOSIS — M25511 Pain in right shoulder: Secondary | ICD-10-CM | POA: Diagnosis not present

## 2018-02-13 DIAGNOSIS — M25511 Pain in right shoulder: Secondary | ICD-10-CM | POA: Diagnosis not present

## 2018-02-13 DIAGNOSIS — G8929 Other chronic pain: Secondary | ICD-10-CM | POA: Diagnosis not present

## 2018-02-15 DIAGNOSIS — M25511 Pain in right shoulder: Secondary | ICD-10-CM | POA: Diagnosis not present

## 2018-02-15 DIAGNOSIS — G8929 Other chronic pain: Secondary | ICD-10-CM | POA: Diagnosis not present

## 2018-02-20 DIAGNOSIS — M25511 Pain in right shoulder: Secondary | ICD-10-CM | POA: Diagnosis not present

## 2018-02-20 DIAGNOSIS — G8929 Other chronic pain: Secondary | ICD-10-CM | POA: Diagnosis not present

## 2018-02-23 DIAGNOSIS — G8929 Other chronic pain: Secondary | ICD-10-CM | POA: Diagnosis not present

## 2018-02-23 DIAGNOSIS — M25511 Pain in right shoulder: Secondary | ICD-10-CM | POA: Diagnosis not present

## 2018-02-27 DIAGNOSIS — M25511 Pain in right shoulder: Secondary | ICD-10-CM | POA: Diagnosis not present

## 2018-02-27 DIAGNOSIS — G8929 Other chronic pain: Secondary | ICD-10-CM | POA: Diagnosis not present

## 2018-03-01 DIAGNOSIS — G8929 Other chronic pain: Secondary | ICD-10-CM | POA: Diagnosis not present

## 2018-03-01 DIAGNOSIS — M25511 Pain in right shoulder: Secondary | ICD-10-CM | POA: Diagnosis not present

## 2018-03-06 DIAGNOSIS — M25511 Pain in right shoulder: Secondary | ICD-10-CM | POA: Diagnosis not present

## 2018-03-06 DIAGNOSIS — G8929 Other chronic pain: Secondary | ICD-10-CM | POA: Diagnosis not present

## 2018-03-08 DIAGNOSIS — G8929 Other chronic pain: Secondary | ICD-10-CM | POA: Diagnosis not present

## 2018-03-08 DIAGNOSIS — M25511 Pain in right shoulder: Secondary | ICD-10-CM | POA: Diagnosis not present

## 2018-03-13 DIAGNOSIS — M25511 Pain in right shoulder: Secondary | ICD-10-CM | POA: Diagnosis not present

## 2018-03-13 DIAGNOSIS — G8929 Other chronic pain: Secondary | ICD-10-CM | POA: Diagnosis not present

## 2018-03-15 DIAGNOSIS — G8929 Other chronic pain: Secondary | ICD-10-CM | POA: Diagnosis not present

## 2018-03-15 DIAGNOSIS — M25511 Pain in right shoulder: Secondary | ICD-10-CM | POA: Diagnosis not present

## 2018-03-20 DIAGNOSIS — G8929 Other chronic pain: Secondary | ICD-10-CM | POA: Diagnosis not present

## 2018-03-20 DIAGNOSIS — M25511 Pain in right shoulder: Secondary | ICD-10-CM | POA: Diagnosis not present

## 2018-03-23 DIAGNOSIS — G8929 Other chronic pain: Secondary | ICD-10-CM | POA: Diagnosis not present

## 2018-03-23 DIAGNOSIS — M25511 Pain in right shoulder: Secondary | ICD-10-CM | POA: Diagnosis not present

## 2018-03-27 DIAGNOSIS — G8929 Other chronic pain: Secondary | ICD-10-CM | POA: Diagnosis not present

## 2018-03-27 DIAGNOSIS — M25511 Pain in right shoulder: Secondary | ICD-10-CM | POA: Diagnosis not present

## 2018-03-29 DIAGNOSIS — G8929 Other chronic pain: Secondary | ICD-10-CM | POA: Diagnosis not present

## 2018-03-29 DIAGNOSIS — M25511 Pain in right shoulder: Secondary | ICD-10-CM | POA: Diagnosis not present

## 2018-04-03 DIAGNOSIS — G8929 Other chronic pain: Secondary | ICD-10-CM | POA: Diagnosis not present

## 2018-04-03 DIAGNOSIS — M25511 Pain in right shoulder: Secondary | ICD-10-CM | POA: Diagnosis not present

## 2018-04-05 DIAGNOSIS — G8929 Other chronic pain: Secondary | ICD-10-CM | POA: Diagnosis not present

## 2018-04-05 DIAGNOSIS — M25511 Pain in right shoulder: Secondary | ICD-10-CM | POA: Diagnosis not present

## 2018-04-12 DIAGNOSIS — G8929 Other chronic pain: Secondary | ICD-10-CM | POA: Diagnosis not present

## 2018-04-12 DIAGNOSIS — M25511 Pain in right shoulder: Secondary | ICD-10-CM | POA: Diagnosis not present

## 2018-04-17 DIAGNOSIS — G8929 Other chronic pain: Secondary | ICD-10-CM | POA: Diagnosis not present

## 2018-04-17 DIAGNOSIS — M25511 Pain in right shoulder: Secondary | ICD-10-CM | POA: Diagnosis not present

## 2018-04-19 DIAGNOSIS — G8929 Other chronic pain: Secondary | ICD-10-CM | POA: Diagnosis not present

## 2018-04-19 DIAGNOSIS — M25511 Pain in right shoulder: Secondary | ICD-10-CM | POA: Diagnosis not present

## 2018-04-26 DIAGNOSIS — G8929 Other chronic pain: Secondary | ICD-10-CM | POA: Diagnosis not present

## 2018-04-26 DIAGNOSIS — M25511 Pain in right shoulder: Secondary | ICD-10-CM | POA: Diagnosis not present

## 2018-05-01 DIAGNOSIS — M25511 Pain in right shoulder: Secondary | ICD-10-CM | POA: Diagnosis not present

## 2018-05-01 DIAGNOSIS — G8929 Other chronic pain: Secondary | ICD-10-CM | POA: Diagnosis not present

## 2018-05-03 DIAGNOSIS — M25511 Pain in right shoulder: Secondary | ICD-10-CM | POA: Diagnosis not present

## 2018-05-03 DIAGNOSIS — G8929 Other chronic pain: Secondary | ICD-10-CM | POA: Diagnosis not present

## 2018-05-07 DIAGNOSIS — S46011D Strain of muscle(s) and tendon(s) of the rotator cuff of right shoulder, subsequent encounter: Secondary | ICD-10-CM | POA: Diagnosis not present

## 2018-05-07 DIAGNOSIS — M7581 Other shoulder lesions, right shoulder: Secondary | ICD-10-CM | POA: Diagnosis not present

## 2018-05-10 DIAGNOSIS — M25511 Pain in right shoulder: Secondary | ICD-10-CM | POA: Diagnosis not present

## 2018-05-10 DIAGNOSIS — G8929 Other chronic pain: Secondary | ICD-10-CM | POA: Diagnosis not present

## 2018-05-16 DIAGNOSIS — M25511 Pain in right shoulder: Secondary | ICD-10-CM | POA: Diagnosis not present

## 2018-05-16 DIAGNOSIS — G8929 Other chronic pain: Secondary | ICD-10-CM | POA: Diagnosis not present

## 2018-05-22 DIAGNOSIS — G8929 Other chronic pain: Secondary | ICD-10-CM | POA: Diagnosis not present

## 2018-05-22 DIAGNOSIS — M25511 Pain in right shoulder: Secondary | ICD-10-CM | POA: Diagnosis not present

## 2018-05-31 DIAGNOSIS — G8929 Other chronic pain: Secondary | ICD-10-CM | POA: Diagnosis not present

## 2018-05-31 DIAGNOSIS — M25511 Pain in right shoulder: Secondary | ICD-10-CM | POA: Diagnosis not present

## 2018-07-04 DIAGNOSIS — Z0001 Encounter for general adult medical examination with abnormal findings: Secondary | ICD-10-CM | POA: Diagnosis not present

## 2018-07-04 DIAGNOSIS — E291 Testicular hypofunction: Secondary | ICD-10-CM | POA: Diagnosis not present

## 2018-07-04 DIAGNOSIS — I10 Essential (primary) hypertension: Secondary | ICD-10-CM | POA: Diagnosis not present

## 2018-07-11 ENCOUNTER — Ambulatory Visit (INDEPENDENT_AMBULATORY_CARE_PROVIDER_SITE_OTHER): Payer: 59 | Admitting: Urology

## 2018-07-11 ENCOUNTER — Encounter: Payer: Self-pay | Admitting: Urology

## 2018-07-11 VITALS — BP 138/73 | HR 63 | Ht 72.0 in | Wt 244.4 lb

## 2018-07-11 DIAGNOSIS — E291 Testicular hypofunction: Secondary | ICD-10-CM | POA: Insufficient documentation

## 2018-07-11 DIAGNOSIS — N5201 Erectile dysfunction due to arterial insufficiency: Secondary | ICD-10-CM | POA: Diagnosis not present

## 2018-07-11 DIAGNOSIS — E785 Hyperlipidemia, unspecified: Secondary | ICD-10-CM | POA: Insufficient documentation

## 2018-07-11 DIAGNOSIS — N401 Enlarged prostate with lower urinary tract symptoms: Secondary | ICD-10-CM

## 2018-07-11 DIAGNOSIS — I10 Essential (primary) hypertension: Secondary | ICD-10-CM | POA: Insufficient documentation

## 2018-07-11 DIAGNOSIS — R011 Cardiac murmur, unspecified: Secondary | ICD-10-CM | POA: Insufficient documentation

## 2018-07-11 NOTE — Progress Notes (Signed)
07/11/2018 2:41 PM   Nathan Herman Mar 15, 1961 [id number]  Referring provider: Leonel Ramsay, MD [place], [place] [address]  Chief Complaint  Patient presents with  . Follow-up    HPI: 57 year-old male previously followed by me at [facility] for erectile dysfunction and BPH with lower urinary tract symptoms.  I last saw him in July 2018.  At that time he was using 5 mg of tadalafil and 10 mg of sildenafil together which was effective and the headache he had with sildenafil alone.  He has been able to increase the sildenafil and presently only takes tadalafil on occasion.  He has stable lower urinary tract symptoms which are not bothersome.  His last PSA August 2018 was 1.5.   PMH: Past Medical History:  Diagnosis Date  . Arthritis    big toe    Surgical History: Past Surgical History:  Procedure Laterality Date  . HERNIA REPAIR     umbilical  . SHOULDER ARTHROSCOPY WITH OPEN ROTATOR CUFF REPAIR Right 12/19/2017   Procedure: SHOULDER ARTHROSCOPY WITH OPEN ROTATOR CUFF REPAIR;  Surgeon: Corky Mull, MD;  Location: [facility] ORS;  Service: Orthopedics;  Laterality: Right;    Home Medications:  Allergies as of 07/11/2018   No Known Allergies     Medication List        Accurate as of 07/11/18  2:41 PM. Always use your most recent med list.          ADVIL PM 200-25 MG Caps Generic drug:  Ibuprofen-diphenhydrAMINE HCl Take 1.5 tablets by mouth at bedtime as needed (sleep).   cholecalciferol 1000 units tablet Commonly known as:  VITAMIN D Take 1,000 Units by mouth daily.   diclofenac sodium 1 % Gel Commonly known as:  VOLTAREN Apply 1 application topically at bedtime. To right shoulder   omega-3 acid ethyl esters 1 g capsule Commonly known as:  LOVAZA Take by mouth.   sildenafil 20 MG tablet Commonly known as:  REVATIO Take 20 mg by mouth as needed.   tadalafil 5 MG tablet Commonly known as:  CIALIS Take 5 mg by mouth as needed for erectile  dysfunction.   vitamin E 400 UNIT capsule Take 400 Units by mouth daily.       Allergies: No Known Allergies  Family History: No family history on file.  Social History:  reports that he quit smoking about 5 years ago. His smoking use included cigars. He quit after 8.00 years of use. He has never used smokeless tobacco. He reports that he does not drink alcohol or use drugs.  ROS: UROLOGY Frequent Urination?: No Hard to postpone urination?: No Burning/pain with urination?: No Get up at night to urinate?: No Leakage of urine?: No Urine stream starts and stops?: No Trouble starting stream?: No Do you have to strain to urinate?: No Blood in urine?: No Urinary tract infection?: No Sexually transmitted disease?: No Injury to kidneys or bladder?: No Painful intercourse?: No Weak stream?: No Erection problems?: No Penile pain?: No  Gastrointestinal Nausea?: No Vomiting?: No Indigestion/heartburn?: No Diarrhea?: No Constipation?: No  Constitutional Fever: No Night sweats?: No Weight loss?: No Fatigue?: No  Skin Skin rash/lesions?: No Itching?: No  Eyes Blurred vision?: No Double vision?: No  Ears/Nose/Throat Sore throat?: No Sinus problems?: No  Hematologic/Lymphatic Swollen glands?: No Easy bruising?: No  Cardiovascular Leg swelling?: No Chest pain?: No  Respiratory Cough?: No Shortness of breath?: No  Endocrine Excessive thirst?: No  Musculoskeletal Back pain?: No  Joint pain?: No  Neurological Headaches?: No Dizziness?: No  Psychologic Depression?: No Anxiety?: No  Physical Exam: BP 138/73 (BP Location: Left Arm, Patient Position: Sitting, Cuff Size: Large)   Pulse 63   Ht 6' (1.829 m)   Wt 244 lb 6.4 oz (110.9 kg)   BMI 33.15 kg/m   Constitutional:  Alert and oriented, No acute distress. HEENT: [place] AT, moist mucus membranes.  Trachea midline, no masses. Cardiovascular: No clubbing, cyanosis, or edema. Respiratory: Normal  respiratory effort, no increased work of breathing. GI: Abdomen is soft, nontender, nondistended, no abdominal masses GU: No CVA tenderness.  Prostate 45 g, smooth without nodules Lymph: No cervical or inguinal lymphadenopathy. Skin: No rashes, bruises or suspicious lesions. Neurologic: Grossly intact, no focal deficits, moving all 4 extremities. Psychiatric: Normal mood and affect.   Assessment & Plan:    1. Benign prostatic hyperplasia with lower urinary tract symptoms, symptom details unspecified Mild lower urinary tract symptoms which are not bothersome.  He is not on medical management.  He desires to continue PSA testing and will be notified with results.  Continue annual follow-up.  2.  Erectile dysfunction He requested a refill on sildenafil.    Abbie Sons, [place] [address], [place], [place] [address] [phone]

## 2018-07-12 ENCOUNTER — Telehealth: Payer: Self-pay

## 2018-07-12 LAB — PSA: Prostate Specific Ag, Serum: 1.3 ng/mL (ref 0.0–4.0)

## 2018-07-12 NOTE — Telephone Encounter (Signed)
Called pt no answer. LM for pt informing him of results below.

## 2018-07-12 NOTE — Telephone Encounter (Signed)
-----   Message from Abbie Sons, MD sent at 07/12/2018  9:53 AM EDT ----- PSA stable at 1.3

## 2018-07-13 ENCOUNTER — Ambulatory Visit
Admission: RE | Admit: 2018-07-13 | Discharge: 2018-07-13 | Disposition: A | Discharge status: Home / Self Care | Payer: 59 | Source: Ambulatory Visit | Attending: Urology | Admitting: Urology

## 2018-08-15 DIAGNOSIS — M25512 Pain in left shoulder: Secondary | ICD-10-CM | POA: Diagnosis not present

## 2018-08-15 DIAGNOSIS — G5602 Carpal tunnel syndrome, left upper limb: Secondary | ICD-10-CM | POA: Diagnosis not present

## 2018-08-15 DIAGNOSIS — M7542 Impingement syndrome of left shoulder: Secondary | ICD-10-CM | POA: Diagnosis not present

## 2018-08-15 DIAGNOSIS — M7522 Bicipital tendinitis, left shoulder: Secondary | ICD-10-CM | POA: Diagnosis not present

## 2018-09-19 ENCOUNTER — Other Ambulatory Visit: Payer: Self-pay | Admitting: Student

## 2018-09-19 DIAGNOSIS — M7522 Bicipital tendinitis, left shoulder: Secondary | ICD-10-CM

## 2018-09-19 DIAGNOSIS — M7582 Other shoulder lesions, left shoulder: Secondary | ICD-10-CM | POA: Diagnosis not present

## 2018-09-20 ENCOUNTER — Other Ambulatory Visit: Payer: Self-pay | Admitting: Student

## 2018-09-20 DIAGNOSIS — M7522 Bicipital tendinitis, left shoulder: Secondary | ICD-10-CM

## 2018-09-20 DIAGNOSIS — M7582 Other shoulder lesions, left shoulder: Secondary | ICD-10-CM

## 2018-10-05 ENCOUNTER — Ambulatory Visit
Admission: RE | Admit: 2018-10-05 | Discharge: 2018-10-05 | Disposition: A | Discharge status: Home / Self Care | Payer: 59 | Source: Ambulatory Visit | Attending: Student | Admitting: Student

## 2018-10-05 DIAGNOSIS — M7552 Bursitis of left shoulder: Secondary | ICD-10-CM | POA: Insufficient documentation

## 2018-10-05 DIAGNOSIS — S4992XA Unspecified injury of left shoulder and upper arm, initial encounter: Secondary | ICD-10-CM | POA: Diagnosis not present

## 2018-10-05 DIAGNOSIS — M7522 Bicipital tendinitis, left shoulder: Secondary | ICD-10-CM | POA: Insufficient documentation

## 2018-10-05 DIAGNOSIS — M7582 Other shoulder lesions, left shoulder: Secondary | ICD-10-CM | POA: Diagnosis not present

## 2018-10-09 ENCOUNTER — Telehealth: Payer: Self-pay | Admitting: Urology

## 2018-10-09 MED ORDER — TADALAFIL 5 MG PO TABS: 5.0000 mg | ORAL_TABLET | ORAL | 0 refills | Status: DC | PRN

## 2018-10-09 MED ORDER — SILDENAFIL CITRATE 20 MG PO TABS: 20.0000 mg | ORAL_TABLET | ORAL | 1 refills | Status: DC | PRN

## 2018-10-09 NOTE — Telephone Encounter (Signed)
rxs sent

## 2018-10-09 NOTE — Telephone Encounter (Signed)
Pt called office asking for medication refills on his Sildenafil at the [facility] at [facility] on [address]. Please advise. Thanks.

## 2018-10-17 DIAGNOSIS — M7582 Other shoulder lesions, left shoulder: Secondary | ICD-10-CM | POA: Diagnosis not present

## 2018-10-17 DIAGNOSIS — D2112 Benign neoplasm of connective and other soft tissue of left upper limb, including shoulder: Secondary | ICD-10-CM | POA: Insufficient documentation

## 2018-10-17 DIAGNOSIS — M75112 Incomplete rotator cuff tear or rupture of left shoulder, not specified as traumatic: Secondary | ICD-10-CM | POA: Diagnosis not present

## 2018-11-13 ENCOUNTER — Encounter
Admission: RE | Admit: 2018-11-13 | Discharge: 2018-11-13 | Disposition: A | Discharge status: Home / Self Care | Payer: 59 | Source: Ambulatory Visit | Attending: Surgery | Admitting: Surgery

## 2018-11-13 ENCOUNTER — Other Ambulatory Visit: Payer: Self-pay

## 2018-11-13 HISTORY — DX: Personal history of urinary calculi: Z87.442

## 2018-11-13 NOTE — Patient Instructions (Signed)
Your procedure is scheduled on: 11-20-18 Report to Same Day Surgery 2nd floor medical mall [facility] Entrance-take elevator on left to 2nd floor.  Check in with surgery information desk.) To find out your arrival time please call [phone] between 1PM - 3PM on 11-19-18  Remember: Instructions that are not followed completely may result in serious medical risk, up to and including death, or upon the discretion of your surgeon and anesthesiologist your surgery may need to be rescheduled.    _x___ 1. Do not eat food after midnight the night before your procedure. You may drink clear liquids up to 2 hours before you are scheduled to arrive at the hospital for your procedure.  Do not drink clear liquids within 2 hours of your scheduled arrival to the hospital.  Clear liquids include  --Water or Apple juice without pulp  --Clear carbohydrate beverage such as ClearFast or Gatorade  --Black Coffee or Clear Tea (No milk, no creamers, do not add anything to the coffee or Tea Type 1 and type 2 diabetics should only drink water.   ____Ensure clear carbohydrate drink on the way to the hospital for bariatric patients  ____Ensure clear carbohydrate drink 3 hours before surgery for Dr Dwyane Luo patients if physician instructed.   No gum chewing or hard candies.     __x__ 2. No Alcohol for 24 hours before or after surgery.   __x__3. No Smoking or e-cigarettes for 24 prior to surgery.  Do not use any chewable tobacco products for at least 6 hour prior to surgery   ____  4. Bring all medications with you on the day of surgery if instructed.    __x__ 5. Notify your doctor if there is any change in your medical condition     (cold, fever, infections).    x___6. On the morning of surgery brush your teeth with toothpaste and water.  You may rinse your mouth with mouth wash if you wish.  Do not swallow any toothpaste or mouthwash.   Do not wear jewelry, make-up, hairpins, clips or nail polish.  Do not  wear lotions, powders, or perfumes. You may wear deodorant.  Do not shave 48 hours prior to surgery. Men may shave face and neck.  Do not bring valuables to the hospital.    [facility] is not responsible for any belongings or valuables.               Contacts, dentures or bridgework may not be worn into surgery.  Leave your suitcase in the car. After surgery it may be brought to your room.  For patients admitted to the hospital, discharge time is determined by your treatment team.  _  Patients discharged the day of surgery will not be allowed to drive home.  You will need someone to drive you home and stay with you the night of your procedure.    Please read over the following fact sheets that you were given:   [facility] Preparing for Surgery and or MRSA Information   ____ Take anti-hypertensive listed below, cardiac, seizure, asthma, anti-reflux and psychiatric medicines. These include:  1. NONE  2.  3.  4.  5.  6.  ____Fleets enema or Magnesium Citrate as directed.   ____ Use CHG Soap or sage wipes as directed on instruction sheet   ____ Use inhalers on the day of surgery and bring to hospital day of surgery  ____ Stop Metformin and Janumet 2 days prior to surgery.  ____ Take 1/2 of usual insulin dose the night before surgery and none on the morning surgery.   ____ Follow recommendations from Cardiologist, Pulmonologist or PCP regarding stopping Aspirin, Coumadin, Plavix ,Eliquis, Effient, or Pradaxa, and Pletal.  X____Stop Anti-inflammatories such as Advil, Aleve, Ibuprofen, Motrin, Naproxen, Naprosyn, Goodies powders or aspirin products NOW- OK to take Tylenol    _x___ Stop supplements until after surgery-STOP KRILL OIL, FISH OIL, AND VITAMIN E NOW   ____ Bring C-Pap to the hospital.

## 2018-11-19 MED ORDER — CEFAZOLIN SODIUM-DEXTROSE 2-4 GM/100ML-% IV SOLN
2.0000 g | Freq: Once | INTRAVENOUS | Status: AC
  Administered 2018-11-20: 2 g via INTRAVENOUS

## 2018-11-20 ENCOUNTER — Other Ambulatory Visit: Payer: Self-pay

## 2018-11-20 ENCOUNTER — Ambulatory Visit: Payer: 59 | Admitting: Anesthesiology

## 2018-11-20 ENCOUNTER — Encounter
Admission: RE | Disposition: A | Discharge status: Home / Self Care | Payer: Self-pay | Source: Home / Self Care | Attending: Surgery

## 2018-11-20 ENCOUNTER — Ambulatory Visit
Admission: RE | Admit: 2018-11-20 | Discharge: 2018-11-20 | Disposition: A | Discharge status: Home / Self Care | Payer: 59 | Attending: Surgery | Admitting: Surgery

## 2018-11-20 DIAGNOSIS — M75122 Complete rotator cuff tear or rupture of left shoulder, not specified as traumatic: Secondary | ICD-10-CM | POA: Insufficient documentation

## 2018-11-20 DIAGNOSIS — I10 Essential (primary) hypertension: Secondary | ICD-10-CM | POA: Diagnosis not present

## 2018-11-20 DIAGNOSIS — M75112 Incomplete rotator cuff tear or rupture of left shoulder, not specified as traumatic: Secondary | ICD-10-CM | POA: Diagnosis not present

## 2018-11-20 DIAGNOSIS — Z79899 Other long term (current) drug therapy: Secondary | ICD-10-CM | POA: Diagnosis not present

## 2018-11-20 DIAGNOSIS — M7542 Impingement syndrome of left shoulder: Secondary | ICD-10-CM | POA: Diagnosis not present

## 2018-11-20 DIAGNOSIS — Z791 Long term (current) use of non-steroidal anti-inflammatories (NSAID): Secondary | ICD-10-CM | POA: Insufficient documentation

## 2018-11-20 DIAGNOSIS — M7582 Other shoulder lesions, left shoulder: Secondary | ICD-10-CM | POA: Diagnosis not present

## 2018-11-20 DIAGNOSIS — E785 Hyperlipidemia, unspecified: Secondary | ICD-10-CM | POA: Insufficient documentation

## 2018-11-20 DIAGNOSIS — Z87891 Personal history of nicotine dependence: Secondary | ICD-10-CM | POA: Diagnosis not present

## 2018-11-20 DIAGNOSIS — D2112 Benign neoplasm of connective and other soft tissue of left upper limb, including shoulder: Secondary | ICD-10-CM | POA: Diagnosis not present

## 2018-11-20 DIAGNOSIS — M24112 Other articular cartilage disorders, left shoulder: Secondary | ICD-10-CM | POA: Diagnosis not present

## 2018-11-20 HISTORY — PX: SHOULDER ARTHROSCOPY WITH SUBACROMIAL DECOMPRESSION AND BICEP TENDON REPAIR: SHX5689

## 2018-11-20 SURGERY — SHOULDER ARTHROSCOPY WITH SUBACROMIAL DECOMPRESSION AND BICEP TENDON REPAIR
Anesthesia: General | Site: Shoulder | Laterality: Left

## 2018-11-20 MED ORDER — BUPIVACAINE HCL (PF) 0.5 % IJ SOLN
INTRAMUSCULAR | Status: AC
  Filled 2018-11-20: qty 10

## 2018-11-20 MED ORDER — BUPIVACAINE-EPINEPHRINE (PF) 0.5% -1:200000 IJ SOLN
INTRAMUSCULAR | Status: DC | PRN
  Administered 2018-11-20: 30 mL

## 2018-11-20 MED ORDER — LACTATED RINGERS IV SOLN
INTRAVENOUS | Status: DC
  Administered 2018-11-20: 09:00:00 via INTRAVENOUS

## 2018-11-20 MED ORDER — ROCURONIUM BROMIDE 100 MG/10ML IV SOLN
INTRAVENOUS | Status: DC | PRN
  Administered 2018-11-20: 80 mg via INTRAVENOUS
  Administered 2018-11-20: 20 mg via INTRAVENOUS

## 2018-11-20 MED ORDER — LIDOCAINE HCL (PF) 2 % IJ SOLN
INTRAMUSCULAR | Status: AC
  Filled 2018-11-20: qty 20

## 2018-11-20 MED ORDER — ACETAMINOPHEN 10 MG/ML IV SOLN
INTRAVENOUS | Status: AC
  Filled 2018-11-20: qty 100

## 2018-11-20 MED ORDER — PROPOFOL 10 MG/ML IV BOLUS
INTRAVENOUS | Status: AC
  Filled 2018-11-20: qty 20

## 2018-11-20 MED ORDER — OXYCODONE HCL 5 MG PO TABS
5.0000 mg | ORAL_TABLET | ORAL | Status: DC | PRN
  Filled 2018-11-20: qty 2

## 2018-11-20 MED ORDER — SUGAMMADEX SODIUM 200 MG/2ML IV SOLN
INTRAVENOUS | Status: DC | PRN
  Administered 2018-11-20: 230 mg via INTRAVENOUS

## 2018-11-20 MED ORDER — ROCURONIUM BROMIDE 50 MG/5ML IV SOLN
INTRAVENOUS | Status: AC
  Filled 2018-11-20: qty 2

## 2018-11-20 MED ORDER — MIDAZOLAM HCL 2 MG/2ML IJ SOLN
1.0000 mg | Freq: Once | INTRAMUSCULAR | Status: AC
  Administered 2018-11-20: 1 mg via INTRAVENOUS

## 2018-11-20 MED ORDER — ONDANSETRON HCL 4 MG PO TABS: 4.0000 mg | ORAL_TABLET | Freq: Four times a day (QID) | ORAL | Status: DC | PRN

## 2018-11-20 MED ORDER — BUPIVACAINE LIPOSOME 1.3 % IJ SUSP
INTRAMUSCULAR | Status: AC
  Filled 2018-11-20: qty 20

## 2018-11-20 MED ORDER — LIDOCAINE HCL (CARDIAC) PF 100 MG/5ML IV SOSY
PREFILLED_SYRINGE | INTRAVENOUS | Status: DC | PRN
  Administered 2018-11-20: 100 mg via INTRAVENOUS

## 2018-11-20 MED ORDER — PROPOFOL 10 MG/ML IV BOLUS
INTRAVENOUS | Status: DC | PRN
  Administered 2018-11-20: 200 mg via INTRAVENOUS

## 2018-11-20 MED ORDER — POTASSIUM CHLORIDE IN NACL 20-0.9 MEQ/L-% IV SOLN: INTRAVENOUS | Status: DC

## 2018-11-20 MED ORDER — BUPIVACAINE-EPINEPHRINE (PF) 0.5% -1:200000 IJ SOLN
INTRAMUSCULAR | Status: AC
  Filled 2018-11-20: qty 90

## 2018-11-20 MED ORDER — DEXAMETHASONE SODIUM PHOSPHATE 4 MG/ML IJ SOLN
INTRAMUSCULAR | Status: DC | PRN
  Administered 2018-11-20: 5 mg via INTRAVENOUS

## 2018-11-20 MED ORDER — FENTANYL CITRATE (PF) 100 MCG/2ML IJ SOLN
INTRAMUSCULAR | Status: AC
  Administered 2018-11-20: 50 ug via INTRAVENOUS
  Filled 2018-11-20: qty 2

## 2018-11-20 MED ORDER — LIDOCAINE HCL (PF) 1 % IJ SOLN
INTRAMUSCULAR | Status: AC
  Filled 2018-11-20: qty 5

## 2018-11-20 MED ORDER — ONDANSETRON HCL 4 MG/2ML IJ SOLN: 4.0000 mg | Freq: Four times a day (QID) | INTRAMUSCULAR | Status: DC | PRN

## 2018-11-20 MED ORDER — GLYCOPYRROLATE 0.2 MG/ML IJ SOLN
INTRAMUSCULAR | Status: DC | PRN
  Administered 2018-11-20: 0.2 mg via INTRAVENOUS

## 2018-11-20 MED ORDER — FENTANYL CITRATE (PF) 250 MCG/5ML IJ SOLN
INTRAMUSCULAR | Status: AC
  Filled 2018-11-20: qty 5

## 2018-11-20 MED ORDER — OXYCODONE HCL 5 MG PO TABS: 5.0000 mg | ORAL_TABLET | ORAL | 0 refills | Status: DC | PRN

## 2018-11-20 MED ORDER — SUGAMMADEX SODIUM 200 MG/2ML IV SOLN
INTRAVENOUS | Status: AC
  Filled 2018-11-20: qty 2

## 2018-11-20 MED ORDER — FENTANYL CITRATE (PF) 100 MCG/2ML IJ SOLN
INTRAMUSCULAR | Status: DC | PRN
  Administered 2018-11-20: 75 ug via INTRAVENOUS
  Administered 2018-11-20: 25 ug via INTRAVENOUS

## 2018-11-20 MED ORDER — ONDANSETRON HCL 4 MG/2ML IJ SOLN
INTRAMUSCULAR | Status: DC | PRN
  Administered 2018-11-20: 4 mg via INTRAVENOUS

## 2018-11-20 MED ORDER — ACETAMINOPHEN 10 MG/ML IV SOLN
INTRAVENOUS | Status: DC | PRN
  Administered 2018-11-20: 1000 mg via INTRAVENOUS

## 2018-11-20 MED ORDER — PROMETHAZINE HCL 25 MG/ML IJ SOLN: 6.2500 mg | INTRAMUSCULAR | Status: DC | PRN

## 2018-11-20 MED ORDER — EPINEPHRINE PF 1 MG/ML IJ SOLN
INTRAMUSCULAR | Status: DC | PRN
  Administered 2018-11-20: 1 mg

## 2018-11-20 MED ORDER — CEFAZOLIN SODIUM-DEXTROSE 2-4 GM/100ML-% IV SOLN
INTRAVENOUS | Status: AC
  Filled 2018-11-20: qty 100

## 2018-11-20 MED ORDER — FAMOTIDINE 20 MG PO TABS
ORAL_TABLET | ORAL | Status: AC
  Administered 2018-11-20: 20 mg via ORAL
  Filled 2018-11-20: qty 1

## 2018-11-20 MED ORDER — METOCLOPRAMIDE HCL 5 MG/ML IJ SOLN: 5.0000 mg | Freq: Three times a day (TID) | INTRAMUSCULAR | Status: DC | PRN

## 2018-11-20 MED ORDER — DEXAMETHASONE SODIUM PHOSPHATE 10 MG/ML IJ SOLN
INTRAMUSCULAR | Status: AC
  Filled 2018-11-20: qty 1

## 2018-11-20 MED ORDER — FAMOTIDINE 20 MG PO TABS
20.0000 mg | ORAL_TABLET | Freq: Once | ORAL | Status: AC
  Administered 2018-11-20: 20 mg via ORAL

## 2018-11-20 MED ORDER — MIDAZOLAM HCL 2 MG/2ML IJ SOLN
INTRAMUSCULAR | Status: AC
  Administered 2018-11-20: 1 mg via INTRAVENOUS
  Filled 2018-11-20: qty 2

## 2018-11-20 MED ORDER — ONDANSETRON HCL 4 MG/2ML IJ SOLN
INTRAMUSCULAR | Status: AC
  Filled 2018-11-20: qty 2

## 2018-11-20 MED ORDER — METOCLOPRAMIDE HCL 10 MG PO TABS: 5.0000 mg | ORAL_TABLET | Freq: Three times a day (TID) | ORAL | Status: DC | PRN

## 2018-11-20 MED ORDER — FENTANYL CITRATE (PF) 100 MCG/2ML IJ SOLN
50.0000 ug | Freq: Once | INTRAMUSCULAR | Status: AC
  Administered 2018-11-20: 50 ug via INTRAVENOUS

## 2018-11-20 MED ORDER — PHENYLEPHRINE HCL-NACL 10-0.9 MG/250ML-% IV SOLN
INTRAVENOUS | Status: DC | PRN
  Administered 2018-11-20: 20 ug/min via INTRAVENOUS

## 2018-11-20 MED ORDER — SUGAMMADEX SODIUM 500 MG/5ML IV SOLN
INTRAVENOUS | Status: AC
  Filled 2018-11-20: qty 5

## 2018-11-20 MED ORDER — FENTANYL CITRATE (PF) 100 MCG/2ML IJ SOLN: 25.0000 ug | INTRAMUSCULAR | Status: DC | PRN

## 2018-11-20 SURGICAL SUPPLY — 45 items
ANCHOR JUGGERKNOT WTAP NDL 2.9 (Anchor) ×4 IMPLANT
ANCHOR SUT QUATTRO KNTLS 4.5 (Anchor) ×4 IMPLANT
BIT DRILL JUGRKNT W/NDL BIT2.9 (DRILL) IMPLANT
BLADE FULL RADIUS 3.5 (BLADE) ×3 IMPLANT
BUR ACROMIONIZER 4.0 (BURR) ×3 IMPLANT
CANNULA SHAVER 8MMX76MM (CANNULA) ×3 IMPLANT
CHLORAPREP W/TINT 26ML (MISCELLANEOUS) ×3 IMPLANT
COVER MAYO STAND STRL (DRAPES) IMPLANT
COVER WAND RF STERILE (DRAPES) ×3 IMPLANT
DRAPE IMP U-DRAPE 54X76 (DRAPES) ×6 IMPLANT
DRILL JUGGERKNOT W/NDL BIT 2.9 (DRILL) ×3
ELECT REM PT RETURN 9FT ADLT (ELECTROSURGICAL) ×3
ELECTRODE REM PT RTRN 9FT ADLT (ELECTROSURGICAL) ×1 IMPLANT
GAUZE PETRO XEROFOAM 1X8 (MISCELLANEOUS) ×3 IMPLANT
GAUZE SPONGE 4X4 12PLY STRL (GAUZE/BANDAGES/DRESSINGS) ×3 IMPLANT
GLOVE BIO SURGEON STRL SZ7.5 (GLOVE) ×6 IMPLANT
GLOVE BIO SURGEON STRL SZ8 (GLOVE) ×6 IMPLANT
GLOVE BIOGEL PI IND STRL 8 (GLOVE) ×1 IMPLANT
GLOVE BIOGEL PI INDICATOR 8 (GLOVE) ×2
GLOVE INDICATOR 8.0 STRL GRN (GLOVE) ×3 IMPLANT
GOWN STRL REUS W/ TWL LRG LVL3 (GOWN DISPOSABLE) ×1 IMPLANT
GOWN STRL REUS W/ TWL XL LVL3 (GOWN DISPOSABLE) ×1 IMPLANT
GOWN STRL REUS W/TWL LRG LVL3 (GOWN DISPOSABLE) ×3
GOWN STRL REUS W/TWL XL LVL3 (GOWN DISPOSABLE) ×3
GRASPER SUT 15 45D LOW PRO (SUTURE) IMPLANT
IV LACTATED RINGER IRRG 3000ML (IV SOLUTION) ×3
IV LR IRRIG 3000ML ARTHROMATIC (IV SOLUTION) ×1 IMPLANT
MANIFOLD NEPTUNE II (INSTRUMENTS) ×3 IMPLANT
MASK FACE SPIDER DISP (MASK) ×3 IMPLANT
MAT ABSORB  FLUID 56X50 GRAY (MISCELLANEOUS) ×2
MAT ABSORB FLUID 56X50 GRAY (MISCELLANEOUS) ×1 IMPLANT
NEEDLE HYPO 22GX1.5 SAFETY (NEEDLE) ×3 IMPLANT
PACK ARTHROSCOPY SHOULDER (MISCELLANEOUS) ×3 IMPLANT
SLING ARM LRG DEEP (SOFTGOODS) ×3 IMPLANT
SLING ULTRA II LG (MISCELLANEOUS) ×3 IMPLANT
STAPLER SKIN PROX 35W (STAPLE) ×3 IMPLANT
STRAP SAFETY 5IN WIDE (MISCELLANEOUS) ×3 IMPLANT
SUT ETHIBOND 0 MO6 C/R (SUTURE) ×3 IMPLANT
SUT VIC AB 2-0 CT1 27 (SUTURE) ×6
SUT VIC AB 2-0 CT1 TAPERPNT 27 (SUTURE) ×2 IMPLANT
TAPE MICROFOAM 4IN (TAPE) ×3 IMPLANT
TUBING ARTHRO INFLOW-ONLY STRL (TUBING) ×3 IMPLANT
TUBING CONNECTING 10 (TUBING) ×2 IMPLANT
TUBING CONNECTING 10' (TUBING) ×1
WAND HAND CNTRL MULTIVAC 90 (MISCELLANEOUS) ×3 IMPLANT

## 2018-11-20 NOTE — Op Note (Signed)
11/20/2018  11:48 AM  Patient:   Nathan Herman  Pre-Op Diagnosis:   Nontraumatic incomplete rotator cuff tear, left shoulder.  Post-Op Diagnosis:   Impingement/tendinopathy with nontraumatic full-thickness rotator cuff tear, left shoulder.  Procedure:   Limited arthroscopic debridement, arthroscopic subacromial decompression, and mini-open rotator cuff repair, left shoulder.  Anesthesia:   General endotracheal with interscalene block utilizing Exparel placed preoperatively by the anesthesiologist.  Surgeon:   Pascal Lux, MD  Assistant:   Cameron Proud, PA-C  Findings:   As above.  There was a full-thickness tear of the anterior and middle portions of the supraspinatus measuring approximately 1.5 x 1.5 cm.  The remainder of the rotator cuff was in excellent condition, as was the biceps tendon.  The labrum demonstrated some fraying anteriorly, superiorly, and posteriorly without detachment from the glenoid.  The articular surfaces of the humeral head and glenoid both were in excellent condition.  Complications:   None  Fluids:   800 cc  Estimated blood loss:   10 cc  Tourniquet time:   None  Drains:   None  Closure:   Staples      Brief clinical note:   The patient is a 58 year old male with a long history of gradually worsening left shoulder pain. The patient's symptoms have progressed despite medications, activity modification, etc. The patient's history and examination are consistent with impingement/tendinopathy with a rotator cuff tear. These findings were confirmed by MRI scan. The patient presents at this time for definitive management of these shoulder symptoms.  Procedure:   The patient underwent placement of an interscalene block using Exparel by the anesthesiologist in the preoperative holding area before being brought into the operating room and lain in the supine position. The patient then underwent general endotracheal intubation and anesthesia before being  repositioned in the beach chair position using the beach chair positioner. The left shoulder and upper extremity were prepped with ChloraPrep solution before being draped sterilely. Preoperative antibiotics were administered. A timeout was performed to confirm the proper surgical site before the expected portal sites and incision site were injected with 0.5% Sensorcaine with epinephrine. A posterior portal was created and the glenohumeral joint thoroughly inspected with the findings as described above. An anterior portal was created using an outside-in technique. The labrum and rotator cuff were further probed, again confirming the above-noted findings. The areas of labral fraying were debrided back to stable margins using the full-radius resector. Areas of synovitis also were debrided back to stable margins using the full-radius resector. The biceps tendon was probed and found to be intact and without evidence of fraying or inflammation. The ArthroCare wand was inserted and used to obtain hemostasis as well as to "anneal" the labrum superiorly and anteriorly. The instruments were removed from the joint after suctioning the excess fluid.  The camera was repositioned through the posterior portal into the subacromial space. A separate lateral portal was created using an outside-in technique. The 3.5 mm full-radius resector was introduced and used to perform a subtotal bursectomy. The ArthroCare wand was then inserted and used to remove the periosteal tissue off the undersurface of the anterior third of the acromion as well as to recess the coracoacromial ligament from its attachment along the anterior and lateral margins of the acromion. The 4.0 mm acromionizing bur was introduced and used to complete the decompression by removing the undersurface of the anterior third of the acromion. The full radius resector was reintroduced to remove any residual bony debris before the  ArthroCare wand was reintroduced to obtain  hemostasis. The instruments were then removed from the subacromial space after suctioning the excess fluid.  An approximately 4-5 cm incision was made over the anterolateral aspect of the shoulder beginning at the anterolateral corner of the acromion and extending distally in line with the bicipital groove. This incision was carried down through the subcutaneous tissues to expose the deltoid fascia. The raphae between the anterior and middle thirds was identified and this plane developed to provide access into the subacromial space. Additional bursal tissues were debrided sharply using Metzenbaum scissors. The rotator cuff tear was readily identified. The margins were debrided sharply with a #15 blade and the exposed greater tuberosity roughened with a rongeur. The tear was repaired using two Biomet 2.9 mm JuggerKnot anchors. These sutures were then brought back laterally and secured using two [place] QuatroLink anchors to create a two-layer closure. In addition, several #0 Ethibond interrupted sutures were placed in a side-to-side fashion anteriorly to reapproximate the longitudinal component of the tear. An apparent watertight closure was obtained.  The wound was copiously irrigated with sterile saline solution before the deltoid raphae was reapproximated using 2-0 Vicryl interrupted sutures. The subcutaneous tissues were closed in two layers using 2-0 Vicryl interrupted sutures before the skin was closed using staples. The portal sites also were closed using staples. A sterile bulky dressing was applied to the shoulder before the arm was placed into a shoulder immobilizer. The patient was then awakened, extubated, and returned to the recovery room in satisfactory condition after tolerating the procedure well.

## 2018-11-20 NOTE — Anesthesia Postprocedure Evaluation (Signed)
Anesthesia Post Note  Patient: Nathan Herman  Procedure(s) Performed: SHOULDER ARTHROSCOPY WITH DEBRIDEMENT, DECOMPRESSION VERSUS REPAIR OF ROTATOR CUFF TEAR AND EXCISION OF BENIGN SOFT TISSUE MASS IN THE ANTERIOLATERAL ASPECT OF LEFT SHOULDER (Left Shoulder)  Patient location during evaluation: PACU Anesthesia Type: General Level of consciousness: awake and alert Pain management: pain level controlled Vital Signs Assessment: post-procedure vital signs reviewed and stable Respiratory status: spontaneous breathing, nonlabored ventilation, respiratory function stable and patient connected to nasal cannula oxygen Cardiovascular status: blood pressure returned to baseline and stable Postop Assessment: no apparent nausea or vomiting Anesthetic complications: no     Last Vitals:  Vitals:   11/20/18 1302 11/20/18 1336  BP: (!) 146/75 139/71  Pulse: 68 63  Resp: 16 18  Temp: (!) 36.2 C   SpO2: 98% 100%    Last Pain:  Vitals:   11/20/18 1336  TempSrc:   PainSc: 0-No pain                 Martha Clan

## 2018-11-20 NOTE — Anesthesia Procedure Notes (Signed)
Procedure Name: Intubation Date/Time: 11/20/2018 10:12 AM Performed by: Bernardo Heater, CRNA Pre-anesthesia Checklist: Patient identified, Emergency Drugs available, Suction available and Patient being monitored Patient Re-evaluated:Patient Re-evaluated prior to induction Oxygen Delivery Method: Circle system utilized Preoxygenation: Pre-oxygenation with 100% oxygen Induction Type: IV induction Laryngoscope Size: Mac and 3 Grade View: Grade II Tube size: 7.0 mm Number of attempts: 1 Placement Confirmation: ETT inserted through vocal cords under direct vision,  positive ETCO2 and breath sounds checked- equal and bilateral Secured at: 23 cm Dental Injury: Teeth and Oropharynx as per pre-operative assessment  Comments: Consider straight blade for better view

## 2018-11-20 NOTE — Transfer of Care (Signed)
Immediate Anesthesia Transfer of Care Note  Patient: Nathan Herman  Procedure(s) Performed: SHOULDER ARTHROSCOPY WITH DEBRIDEMENT, DECOMPRESSION VERSUS REPAIR OF ROTATOR CUFF TEAR AND EXCISION OF BENIGN SOFT TISSUE MASS IN THE ANTERIOLATERAL ASPECT OF LEFT SHOULDER (Left Shoulder)  Patient Location: PACU  Anesthesia Type:General  Level of Consciousness: awake, alert , oriented and patient cooperative  Airway & Oxygen Therapy: Patient Spontanous Breathing and Patient connected to nasal cannula oxygen  Post-op Assessment: Report given to RN and Post -op Vital signs reviewed and stable  Post vital signs: Reviewed and stable  Last Vitals:  Vitals Value Taken Time  BP 135/101 11/20/2018 12:00 PM  Temp    Pulse 56 11/20/2018 12:00 PM  Resp 15 11/20/2018 12:00 PM  SpO2 100 % 11/20/2018 12:00 PM  Vitals shown include unvalidated device data.  Last Pain:  Vitals:   11/20/18 0923  TempSrc:   PainSc: 0-No pain         Complications: No apparent anesthesia complications

## 2018-11-20 NOTE — Anesthesia Preprocedure Evaluation (Signed)
Anesthesia Evaluation  Patient identified by MRN, date of birth, ID band Patient awake    Reviewed: Allergy & Precautions, H&P , NPO status , Patient's Chart, lab work & pertinent test results, reviewed documented beta blocker date and time   History of Anesthesia Complications Negative for: history of anesthetic complications  Airway Mallampati: II  TM Distance: >3 FB Neck ROM: full    Dental  (+) Teeth Intact, Caps, Dental Advidsory Given, Missing   Pulmonary neg pulmonary ROS, former smoker,    Pulmonary exam normal        Cardiovascular Exercise Tolerance: Good negative cardio ROS Normal cardiovascular exam Rhythm:regular Rate:Normal     Neuro/Psych negative neurological ROS  negative psych ROS   GI/Hepatic negative GI ROS, Neg liver ROS,   Endo/Other  negative endocrine ROS  Renal/GU negative Renal ROS  negative genitourinary   Musculoskeletal   Abdominal   Peds  Hematology negative hematology ROS (+)   Anesthesia Other Findings Past Medical History: No date: Arthritis     Comment:  big toe Past Surgical History: No date: HERNIA REPAIR     Comment:  umbilical BMI    Body Mass Index:  32.72 kg/m     Reproductive/Obstetrics negative OB ROS                             Anesthesia Physical  Anesthesia Plan  ASA: II  Anesthesia Plan: General ETT   Post-op Pain Management:  Regional for Post-op pain   Induction: Intravenous  PONV Risk Score and Plan: 3 and Ondansetron, Dexamethasone, Midazolam, Promethazine and Treatment may vary due to age or medical condition  Airway Management Planned: Oral ETT  Additional Equipment:   Intra-op Plan:   Post-operative Plan: Extubation in OR  Informed Consent: I have reviewed the patients History and Physical, chart, labs and discussed the procedure including the risks, benefits and alternatives for the proposed anesthesia with the  patient or authorized representative who has indicated his/her understanding and acceptance.   Dental Advisory Given  Plan Discussed with: CRNA  Anesthesia Plan Comments:         Anesthesia Quick Evaluation

## 2018-11-20 NOTE — Discharge Instructions (Addendum)
Interscalene Nerve Block with Exparel  1.  For your surgery you have received an Interscalene Nerve Block with Exparel. 2. Nerve Blocks affect many types of nerves, including nerves that control movement, pain and normal sensation.  You may experience feelings such as numbness, tingling, heaviness, weakness or the inability to move your arm or the feeling or sensation that your arm has "fallen asleep". 3. A nerve block with Exparel can last up to 5 days.  Usually the weakness wears off first.  The tingling and heaviness usually wear off next.  Finally you may start to notice pain.  Keep in mind that this may occur in any order.  Once a nerve block starts to wear off it is usually completely gone within 60 minutes. 4. ISNB may cause mild shortness of breath, a hoarse voice, blurry vision, unequal pupils, or drooping of the face on the same side as the nerve block.  These symptoms will usually resolve with the numbness.  Very rarely the procedure itself can cause mild seizures. 5. If needed, your surgeon will give you a prescription for pain medication.  It will take about 60 minutes for the oral pain medication to become fully effective.  So, it is recommended that you start taking this medication before the nerve block first begins to wear off, or when you first begin to feel discomfort. 6. Take your pain medication only as prescribed.  Pain medication can cause sedation and decrease your breathing if you take more than you need for the level of pain that you have. 7. Nausea is a common side effect of many pain medications.  You may want to eat something before taking your pain medicine to prevent nausea. 8. After an Interscalene nerve block, you cannot feel pain, pressure or extremes in temperature in the effected arm.  Because your arm is numb it is at an increased risk for injury.  To decrease the possibility of injury, please practice the following:  a. While you are awake change the position of  your arm frequently to prevent too much pressure on any one area for prolonged periods of time. b.  If you have a cast or tight dressing, check the color or your fingers every couple of hours.  Call your surgeon with the appearance of any discoloration (white or blue). c. If you are given a sling to wear before you go home, please wear it  at all times until the block has completely worn off.  Do not get up at night without your sling. d. Please contact [facility] or your surgeon if you do not begin to regain sensation after 7 days from the surgery.  Anesthesia may be contacted by calling the Same Day Surgery Department, Mon. through Fri., 6 am to 4 pm at [phone].   e. If you experience any other problems or concerns, please contact your surgeon's office. If you experience severe or prolonged shortness of breath go to the nearest emergency department. AMBULATORY SURGERY  DISCHARGE INSTRUCTIONS   1) The drugs that you were given will stay in your system until tomorrow so for the next 24 hours you should not:  A) Drive an automobile B) Make any legal decisions C) Drink any alcoholic beverage   2) You may resume regular meals tomorrow.  Today it is better to start with liquids and gradually work up to solid foods.  You may eat anything you prefer, but it is better to start with liquids, then soup and crackers, and  gradually work up to solid foods.   3) Please notify your doctor immediately if you have any unusual bleeding, trouble breathing, redness and pain at the surgery site, drainage, fever, or pain not relieved by medication.  4) Your post-operative visit with Dr.                                     is: Date:                        Time:    Please call to schedule your post-operative visit.  5) Additional Instructions: Orthopedic discharge instructions: Keep dressing dry and intact.  May shower after dressing changed on post-op day #4 (Saturday).  Cover staples/sutures  with Band-Aids after drying off. Apply ice frequently to shoulder. Take ibuprofen 800 mg TID with meals for 7-10 days, then as necessary. Take oxycodone as prescribed when needed.  May supplement with ES Tylenol if necessary. Keep shoulder immobilizer on at all times except may remove for bathing purposes. Follow-up in 10-14 days or as scheduled.

## 2018-11-20 NOTE — H&P (Signed)
Paper H&P to be scanned into permanent record. H&P reviewed and patient re-examined. No changes. 

## 2018-11-20 NOTE — Anesthesia Post-op Follow-up Note (Signed)
Anesthesia QCDR form completed.        

## 2018-11-21 ENCOUNTER — Encounter: Payer: Self-pay | Admitting: Surgery

## 2018-11-21 LAB — SURGICAL PATHOLOGY

## 2018-11-23 DIAGNOSIS — M24112 Other articular cartilage disorders, left shoulder: Secondary | ICD-10-CM | POA: Insufficient documentation

## 2018-11-26 DIAGNOSIS — M75122 Complete rotator cuff tear or rupture of left shoulder, not specified as traumatic: Secondary | ICD-10-CM | POA: Diagnosis not present

## 2018-12-04 DIAGNOSIS — M75122 Complete rotator cuff tear or rupture of left shoulder, not specified as traumatic: Secondary | ICD-10-CM | POA: Diagnosis not present

## 2018-12-11 DIAGNOSIS — M75122 Complete rotator cuff tear or rupture of left shoulder, not specified as traumatic: Secondary | ICD-10-CM | POA: Diagnosis not present

## 2018-12-14 ENCOUNTER — Encounter: Payer: Self-pay | Admitting: Surgery

## 2018-12-25 DIAGNOSIS — M75122 Complete rotator cuff tear or rupture of left shoulder, not specified as traumatic: Secondary | ICD-10-CM | POA: Diagnosis not present

## 2019-01-01 DIAGNOSIS — M75122 Complete rotator cuff tear or rupture of left shoulder, not specified as traumatic: Secondary | ICD-10-CM | POA: Diagnosis not present

## 2019-01-03 DIAGNOSIS — M75122 Complete rotator cuff tear or rupture of left shoulder, not specified as traumatic: Secondary | ICD-10-CM | POA: Diagnosis not present

## 2019-01-08 DIAGNOSIS — M75122 Complete rotator cuff tear or rupture of left shoulder, not specified as traumatic: Secondary | ICD-10-CM | POA: Diagnosis not present

## 2019-01-10 DIAGNOSIS — M75122 Complete rotator cuff tear or rupture of left shoulder, not specified as traumatic: Secondary | ICD-10-CM | POA: Diagnosis not present

## 2019-01-15 DIAGNOSIS — M75122 Complete rotator cuff tear or rupture of left shoulder, not specified as traumatic: Secondary | ICD-10-CM | POA: Diagnosis not present

## 2019-01-17 DIAGNOSIS — M75122 Complete rotator cuff tear or rupture of left shoulder, not specified as traumatic: Secondary | ICD-10-CM | POA: Diagnosis not present

## 2019-01-22 DIAGNOSIS — M75122 Complete rotator cuff tear or rupture of left shoulder, not specified as traumatic: Secondary | ICD-10-CM | POA: Diagnosis not present

## 2019-01-24 DIAGNOSIS — M75122 Complete rotator cuff tear or rupture of left shoulder, not specified as traumatic: Secondary | ICD-10-CM | POA: Diagnosis not present

## 2019-01-27 DIAGNOSIS — H6122 Impacted cerumen, left ear: Secondary | ICD-10-CM | POA: Diagnosis not present

## 2019-01-29 DIAGNOSIS — M75122 Complete rotator cuff tear or rupture of left shoulder, not specified as traumatic: Secondary | ICD-10-CM | POA: Diagnosis not present

## 2019-01-30 ENCOUNTER — Other Ambulatory Visit: Payer: Self-pay | Admitting: *Deleted

## 2019-01-30 MED ORDER — SILDENAFIL CITRATE 20 MG PO TABS: 20.0000 mg | ORAL_TABLET | Freq: Every day | ORAL | 1 refills | Status: DC | PRN

## 2019-01-31 DIAGNOSIS — H9319 Tinnitus, unspecified ear: Secondary | ICD-10-CM | POA: Diagnosis not present

## 2019-01-31 DIAGNOSIS — H9392 Unspecified disorder of left ear: Secondary | ICD-10-CM | POA: Diagnosis not present

## 2019-02-05 DIAGNOSIS — M75122 Complete rotator cuff tear or rupture of left shoulder, not specified as traumatic: Secondary | ICD-10-CM | POA: Diagnosis not present

## 2019-02-07 DIAGNOSIS — M75122 Complete rotator cuff tear or rupture of left shoulder, not specified as traumatic: Secondary | ICD-10-CM | POA: Diagnosis not present

## 2019-02-12 DIAGNOSIS — M75122 Complete rotator cuff tear or rupture of left shoulder, not specified as traumatic: Secondary | ICD-10-CM | POA: Diagnosis not present

## 2019-02-14 DIAGNOSIS — M75122 Complete rotator cuff tear or rupture of left shoulder, not specified as traumatic: Secondary | ICD-10-CM | POA: Diagnosis not present

## 2019-02-21 DIAGNOSIS — M75122 Complete rotator cuff tear or rupture of left shoulder, not specified as traumatic: Secondary | ICD-10-CM | POA: Diagnosis not present

## 2019-02-26 DIAGNOSIS — M75122 Complete rotator cuff tear or rupture of left shoulder, not specified as traumatic: Secondary | ICD-10-CM | POA: Diagnosis not present

## 2019-02-28 DIAGNOSIS — M75122 Complete rotator cuff tear or rupture of left shoulder, not specified as traumatic: Secondary | ICD-10-CM | POA: Diagnosis not present

## 2019-03-05 DIAGNOSIS — M75122 Complete rotator cuff tear or rupture of left shoulder, not specified as traumatic: Secondary | ICD-10-CM | POA: Diagnosis not present

## 2019-03-07 DIAGNOSIS — M75122 Complete rotator cuff tear or rupture of left shoulder, not specified as traumatic: Secondary | ICD-10-CM | POA: Diagnosis not present

## 2019-03-12 DIAGNOSIS — M75122 Complete rotator cuff tear or rupture of left shoulder, not specified as traumatic: Secondary | ICD-10-CM | POA: Diagnosis not present

## 2019-03-14 DIAGNOSIS — M75122 Complete rotator cuff tear or rupture of left shoulder, not specified as traumatic: Secondary | ICD-10-CM | POA: Diagnosis not present

## 2019-03-19 DIAGNOSIS — M75122 Complete rotator cuff tear or rupture of left shoulder, not specified as traumatic: Secondary | ICD-10-CM | POA: Diagnosis not present

## 2019-03-21 DIAGNOSIS — M75122 Complete rotator cuff tear or rupture of left shoulder, not specified as traumatic: Secondary | ICD-10-CM | POA: Diagnosis not present

## 2019-03-26 DIAGNOSIS — M75122 Complete rotator cuff tear or rupture of left shoulder, not specified as traumatic: Secondary | ICD-10-CM | POA: Diagnosis not present

## 2019-03-28 ENCOUNTER — Other Ambulatory Visit: Payer: Self-pay | Admitting: *Deleted

## 2019-03-28 MED ORDER — SILDENAFIL CITRATE 20 MG PO TABS: 20.0000 mg | ORAL_TABLET | Freq: Every day | ORAL | 1 refills | Status: DC | PRN

## 2019-04-03 DIAGNOSIS — M75122 Complete rotator cuff tear or rupture of left shoulder, not specified as traumatic: Secondary | ICD-10-CM | POA: Diagnosis not present

## 2019-04-05 DIAGNOSIS — M75122 Complete rotator cuff tear or rupture of left shoulder, not specified as traumatic: Secondary | ICD-10-CM | POA: Diagnosis not present

## 2019-05-19 IMAGING — MR MR SHOULDER*L* W/O CM
5 series · 40 of 40 positions shown · non-contrast
Comparison: None.

CLINICAL DATA: No known injury.  Injured playing softball.

EXAM:
MRI OF THE LEFT SHOULDER WITHOUT CONTRAST
TECHNIQUE: Multiplanar, multisequence MR imaging of the shoulder was performed.
No intravenous contrast was administered.

[Series 3: PD fat-sat · axial · 4.0mm · 0.59mm/px · z∈[-24,+72]mm · 8 of 23 slices shown (1 of 2)]
[im 1/23]
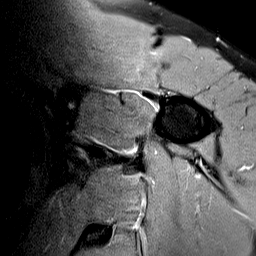
[im 4/23]
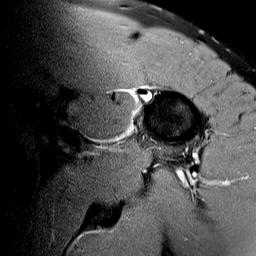
[im 7/23]
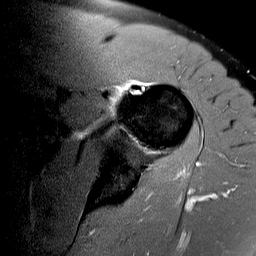
[im 10/23]
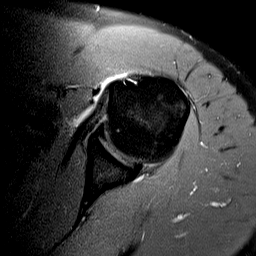
[im 13/23]
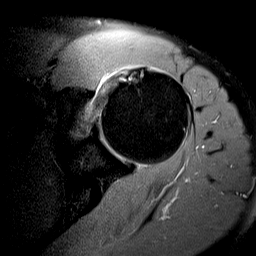
[im 16/23]
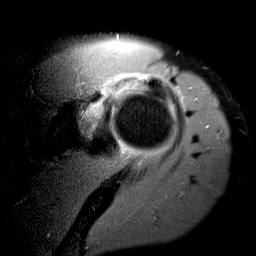
[im 19/23]
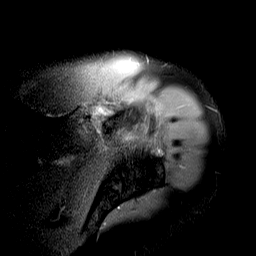
[im 23/23]
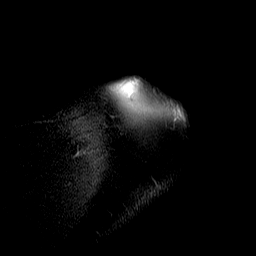

[Series 4: T2 fat-sat · oblique · 4.0mm · 0.59mm/px · 8 of 23 slices shown (1 of 2)]
[im 1/23]
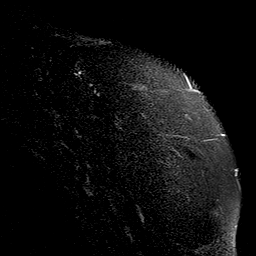
[im 4/23]
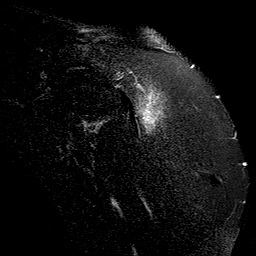
[im 7/23]
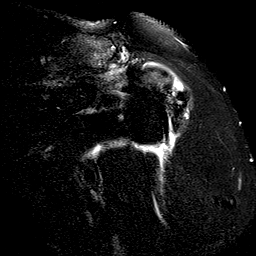
[im 10/23]
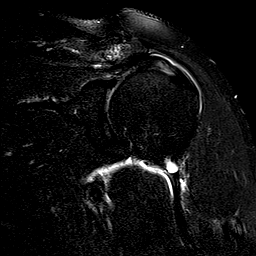
[im 13/23]
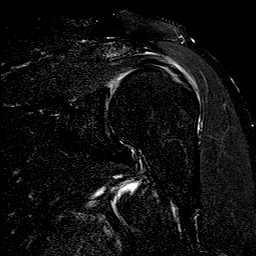
[im 16/23]
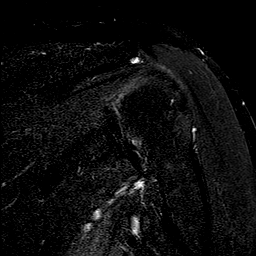
[im 19/23]
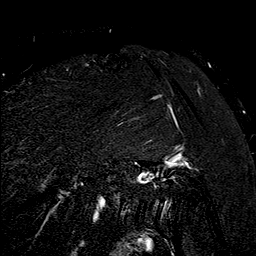
[im 23/23]
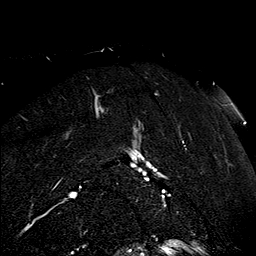

[Series 5: PD fat-sat · oblique · 4.0mm · 0.59mm/px · 8 of 23 slices shown (2 of 2)]
[im 1/23]
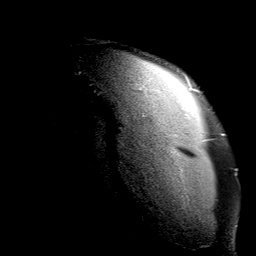
[im 4/23]
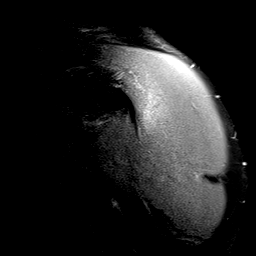
[im 7/23]
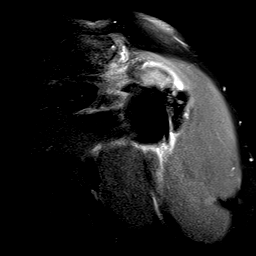
[im 10/23]
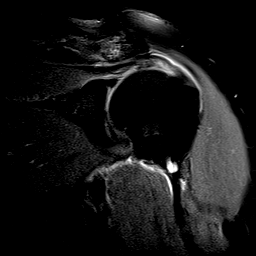
[im 13/23]
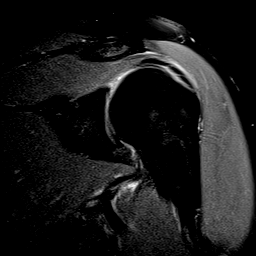
[im 16/23]
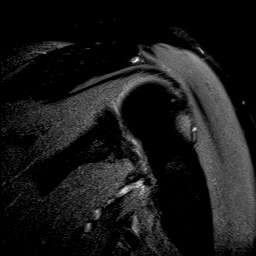
[im 19/23]
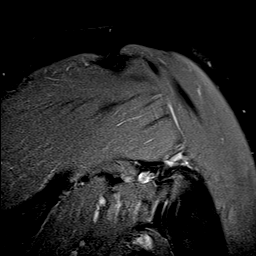
[im 23/23]
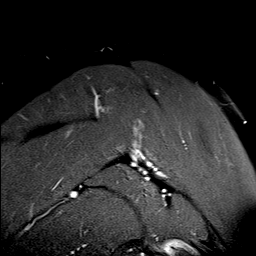

[Series 6: T1 · oblique · 4.0mm · 0.59mm/px · 8 of 23 slices shown]
[im 1/23]
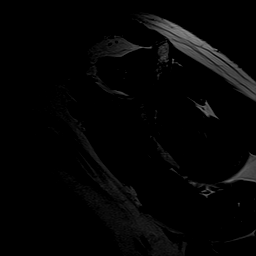
[im 4/23]
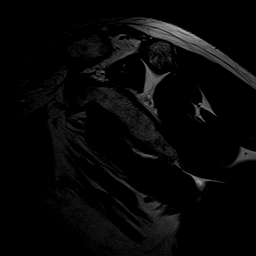
[im 7/23]
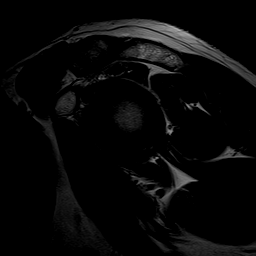
[im 10/23]
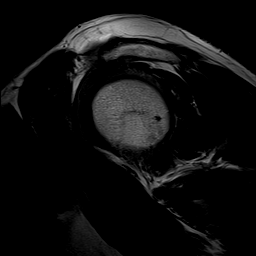
[im 13/23]
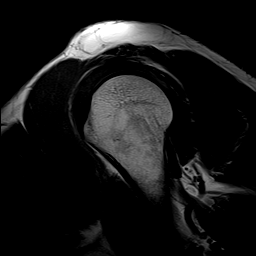
[im 16/23]
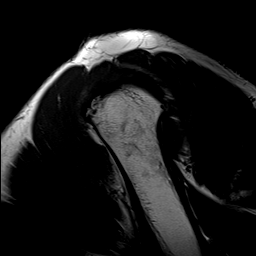
[im 19/23]
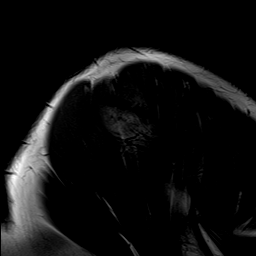
[im 23/23]
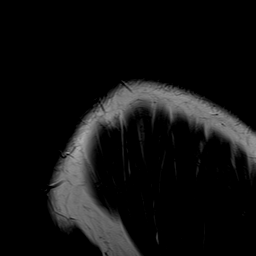

[Series 7: T2 fat-sat · oblique · 4.0mm · 0.59mm/px · 8 of 23 slices shown (2 of 2)]
[im 1/23]
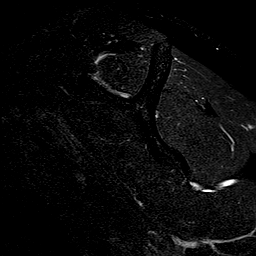
[im 4/23]
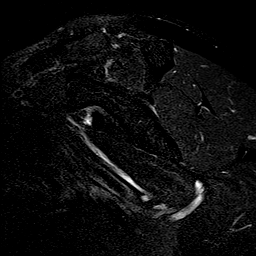
[im 7/23]
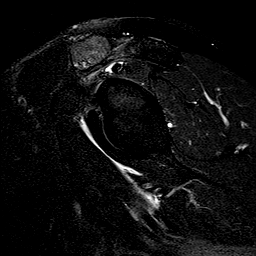
[im 10/23]
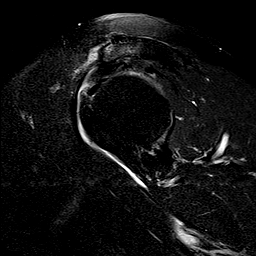
[im 13/23]
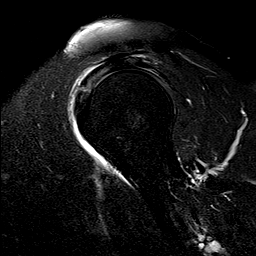
[im 16/23]
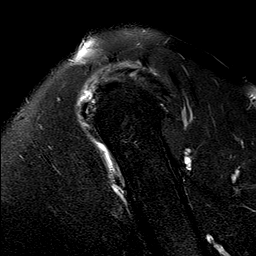
[im 19/23]
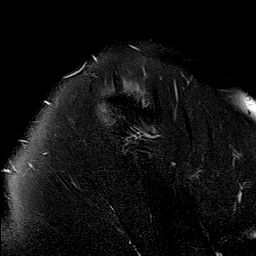
[im 23/23]
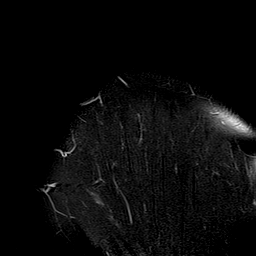

[40 of 40 positions shown; findings below may reference images not displayed]

FINDINGS: Rotator cuff: Severe tendinosis of the supraspinatus tendon with a
partial-thickness bursal surface tear anteriorly. Mild tendinosis of
the infraspinatus tendon. Teres minor tendon is intact.
Subscapularis tendon is intact.

Muscles: No atrophy or fatty replacement of nor abnormal signal
within, the muscles of the rotator cuff.

Biceps long head:  Intact.

Acromioclavicular Joint: Moderate arthropathy of the
acromioclavicular joint. Type II acromion. Small amount of
subacromial/subdeltoid bursal fluid.

Glenohumeral Joint: No joint effusion.  No chondral defect.

Labrum: Grossly intact, but evaluation is limited by lack of
intraarticular fluid.

Bones:  No acute osseous abnormality.  No aggressive osseous lesion.

Other: No fluid collection or hematoma.
IMPRESSION: 1. Severe tendinosis of the supraspinatus tendon with a
partial-thickness bursal surface tear anteriorly.
2. Mild tendinosis of the infraspinatus tendon.
3. Mild subacromial/subdeltoid bursitis.

## 2019-07-12 ENCOUNTER — Encounter: Payer: Self-pay | Admitting: Urology

## 2019-07-12 ENCOUNTER — Ambulatory Visit: Payer: 59 | Admitting: Urology

## 2019-07-12 ENCOUNTER — Other Ambulatory Visit: Payer: Self-pay

## 2019-07-12 VITALS — BP 141/84 | HR 67 | Ht 73.0 in | Wt 236.5 lb

## 2019-07-12 DIAGNOSIS — N5201 Erectile dysfunction due to arterial insufficiency: Secondary | ICD-10-CM

## 2019-07-12 DIAGNOSIS — N401 Enlarged prostate with lower urinary tract symptoms: Secondary | ICD-10-CM

## 2019-07-12 NOTE — Progress Notes (Signed)
07/12/2019 9:28 AM   Nathan Herman 11-05-1961 [id number]  Referring provider: Baxter Hire, MD [place],  [place] [address]  Chief Complaint  Patient presents with  . Benign Prostatic Hypertrophy  . Erectile Dysfunction    HPI: 58 y.o. male presents for annual follow-up of BPH and erectile dysfunction.  He has stable lower urinary tract symptoms which are not bothersome.  He has occasional decreased stream, frequency and nocturia x1.  IPSS completed today was 7/2.  He denies dysuria or gross hematuria.  He has no flank, abdominal, pelvic or scrotal pain.  He is using sildenafil for ED primarily which is effective.  He occasionally uses a 5 mg tadalafil.  He has requested refills on both medication.  PMH: Past Medical History:  Diagnosis Date  . Arthritis    big toe  . History of kidney stones     Surgical History: Past Surgical History:  Procedure Laterality Date  . HERNIA REPAIR     umbilical  . SHOULDER ARTHROSCOPY WITH OPEN ROTATOR CUFF REPAIR Right 12/19/2017   Procedure: SHOULDER ARTHROSCOPY WITH OPEN ROTATOR CUFF REPAIR;  Surgeon: Corky Mull, MD;  Location: [facility];  Service: Orthopedics;  Laterality: Right;  . SHOULDER ARTHROSCOPY WITH SUBACROMIAL DECOMPRESSION AND BICEP TENDON REPAIR Left 11/20/2018   Procedure: SHOULDER ARTHROSCOPY WITH DEBRIDEMENT, DECOMPRESSION VERSUS REPAIR OF ROTATOR CUFF TEAR AND EXCISION OF BENIGN SOFT TISSUE MASS IN THE ANTERIOLATERAL ASPECT OF LEFT SHOULDER;  Surgeon: Corky Mull, MD;  Location: [facility];  Service: Orthopedics;  Laterality: Left;    Home Medications:  Allergies as of 07/12/2019   No Known Allergies     Medication List       Accurate as of July 12, 2019  9:28 AM. If you have any questions, ask your nurse or doctor.        STOP taking these medications   oxyCODONE 5 MG immediate release tablet Commonly known as: Roxicodone Stopped by: Abbie Sons, MD     TAKE these medications    cholecalciferol 1000 units tablet Commonly known as: VITAMIN D Take 1,000 Units by mouth daily.   KRILL OIL PO Take 1 tablet by mouth 2 (two) times a week. TAKES ON MONDAYS AND THURSDAYS   Multi-Vitamin tablet Take by mouth.   Naproxen Sod-diphenhydrAMINE 220-25 MG Tabs Take by mouth.   omega-3 acid ethyl esters 1 g capsule Commonly known as: LOVAZA Take 1 g by mouth daily.   sildenafil 20 MG tablet Commonly known as: REVATIO Take 1 tablet (20 mg total) by mouth daily as needed (ED).   tadalafil 5 MG tablet Commonly known as: CIALIS Take 1 tablet (5 mg total) by mouth as needed for erectile dysfunction.   vitamin E 400 UNIT capsule Take 400 Units by mouth daily.       Allergies: No Known Allergies  Family History: No family history on file.  Social History:  reports that he quit smoking about 6 years ago. His smoking use included cigars. He quit after 8.00 years of use. He has never used smokeless tobacco. He reports that he does not drink alcohol or use drugs.  ROS: UROLOGY Frequent Urination?: No Hard to postpone urination?: No Burning/pain with urination?: No Get up at night to urinate?: No Leakage of urine?: No Urine stream starts and stops?: No Trouble starting stream?: No Do you have to strain to urinate?: No Blood in urine?: No Urinary tract infection?: No Sexually transmitted disease?: No Injury to  kidneys or bladder?: No Painful intercourse?: No Weak stream?: No Erection problems?: No Penile pain?: No  Gastrointestinal Nausea?: No Vomiting?: No Indigestion/heartburn?: No Diarrhea?: No Constipation?: No  Constitutional Fever: No Night sweats?: No Weight loss?: No Fatigue?: No  Skin Skin rash/lesions?: No Itching?: No  Eyes Blurred vision?: No Double vision?: No  Ears/Nose/Throat Sore throat?: No Sinus problems?: No  Hematologic/Lymphatic Swollen glands?: No Easy bruising?: No  Cardiovascular Leg swelling?: No Chest  pain?: No  Respiratory Cough?: No Shortness of breath?: No  Endocrine Excessive thirst?: No  Musculoskeletal Back pain?: No Joint pain?: No  Neurological Headaches?: No Dizziness?: No  Psychologic Depression?: No Anxiety?: No  Physical Exam: BP (!) 141/84 (BP Location: Left Arm, Patient Position: Sitting, Cuff Size: Normal)   Pulse 67   Ht 6\' 1"  (1.854 m)   Wt 236 lb 8 oz (107.3 kg)   BMI 31.20 kg/m   Constitutional:  Alert and oriented, No acute distress. HEENT: [place] AT, moist mucus membranes.  Trachea midline, no masses. Cardiovascular: No clubbing, cyanosis, or edema. Respiratory: Normal respiratory effort, no increased work of breathing. GI: Abdomen is soft, nontender, nondistended, no abdominal masses GU: No CVA tenderness.  Prostate 45 g, smooth without nodules. Skin: No rashes, bruises or suspicious lesions. Neurologic: Grossly intact, no focal deficits, moving all 4 extremities. Psychiatric: Normal mood and affect.   Assessment & Plan:    - Erectile dysfunction Sildenafil and tadalafil were refilled.   - BPH with lower urinary tract symptoms Stable voiding symptoms with minimal bother.  Annual PSA was drawn and he will be notified with results.  Continue annual follow-up.   Abbie Sons, [place] [address], [place], [place] [address] [phone]

## 2019-07-13 LAB — PSA: Prostate Specific Ag, Serum: 1.6 ng/mL (ref 0.0–4.0)

## 2019-07-15 ENCOUNTER — Encounter: Payer: Self-pay | Admitting: Urology

## 2019-07-15 MED ORDER — SILDENAFIL CITRATE 20 MG PO TABS: 20.0000 mg | ORAL_TABLET | Freq: Every day | ORAL | 1 refills | Status: DC | PRN

## 2019-07-15 MED ORDER — TADALAFIL 5 MG PO TABS: 5.0000 mg | ORAL_TABLET | ORAL | 1 refills | Status: DC | PRN

## 2019-07-16 ENCOUNTER — Telehealth: Payer: Self-pay | Admitting: *Deleted

## 2019-07-16 NOTE — Telephone Encounter (Signed)
-----   Message from Abbie Sons, MD sent at 07/16/2019  7:46 AM EDT ----- PSA stable 1.6

## 2019-07-16 NOTE — Telephone Encounter (Signed)
Left message for patient to return call.

## 2019-07-16 NOTE — Telephone Encounter (Signed)
Patient returned call Results given   Sharyn Lull

## 2019-10-24 ENCOUNTER — Other Ambulatory Visit: Payer: Self-pay

## 2019-10-24 DIAGNOSIS — Z20822 Contact with and (suspected) exposure to covid-19: Secondary | ICD-10-CM

## 2019-10-26 LAB — NOVEL CORONAVIRUS, NAA: SARS-CoV-2, NAA: NOT DETECTED

## 2019-12-22 ENCOUNTER — Other Ambulatory Visit: Payer: Self-pay | Admitting: Urology

## 2019-12-25 ENCOUNTER — Encounter: Payer: Self-pay | Admitting: Podiatry

## 2019-12-25 ENCOUNTER — Ambulatory Visit: Payer: 59 | Admitting: Podiatry

## 2019-12-25 ENCOUNTER — Other Ambulatory Visit: Payer: Self-pay

## 2019-12-25 DIAGNOSIS — L603 Nail dystrophy: Secondary | ICD-10-CM

## 2019-12-25 NOTE — Progress Notes (Signed)
  Subjective:  Patient ID: Nathan Herman, male    DOB: 04-30-1961,  MRN: [id number] HPI Chief Complaint  Patient presents with  . Nail Problem    Patient presents today for nail fungus bilat all toes x years.  He reports being a previous patient years ago and taking Lamisil.  He has tried otc fungal medications but nothing worked    59 y.o. male presents with the above complaint.   ROS: Denies fever chills nausea vomiting muscle aches pains calf pain back pain chest pain shortness of breath.  Past Medical History:  Diagnosis Date  . Arthritis    big toe  . History of kidney stones    Past Surgical History:  Procedure Laterality Date  . HERNIA REPAIR     umbilical  . SHOULDER ARTHROSCOPY WITH OPEN ROTATOR CUFF REPAIR Right 12/19/2017   Procedure: SHOULDER ARTHROSCOPY WITH OPEN ROTATOR CUFF REPAIR;  Surgeon: Corky Mull, MD;  Location: [facility];  Service: Orthopedics;  Laterality: Right;  . SHOULDER ARTHROSCOPY WITH SUBACROMIAL DECOMPRESSION AND BICEP TENDON REPAIR Left 11/20/2018   Procedure: SHOULDER ARTHROSCOPY WITH DEBRIDEMENT, DECOMPRESSION VERSUS REPAIR OF ROTATOR CUFF TEAR AND EXCISION OF BENIGN SOFT TISSUE MASS IN THE ANTERIOLATERAL ASPECT OF LEFT SHOULDER;  Surgeon: Corky Mull, MD;  Location: [facility];  Service: Orthopedics;  Laterality: Left;    Current Outpatient Medications:  .  cholecalciferol (VITAMIN D) 1000 units tablet, Take 1,000 Units by mouth daily., Disp: , Rfl:  .  KRILL OIL PO, Take 1 tablet by mouth 2 (two) times a week. TAKES ON MONDAYS AND THURSDAYS, Disp: , Rfl:  .  Multiple Vitamin (MULTI-VITAMIN) tablet, Take by mouth., Disp: , Rfl:  .  omega-3 acid ethyl esters (LOVAZA) 1 g capsule, Take 1 g by mouth daily. , Disp: , Rfl:  .  sildenafil (REVATIO) 20 MG tablet, TAKE 1 TABLET (20 MG TOTAL) BY MOUTH DAILY AS NEEDED (ED)., Disp: 30 tablet, Rfl: 1 .  vitamin E 400 UNIT capsule, Take 400 Units by mouth daily., Disp: , Rfl:   No Known Allergies Review of  Systems Objective:  There were no vitals filed for this visit.  General: Well developed, nourished, in no acute distress, alert and oriented x3   Dermatological: Skin is warm, dry and supple bilateral. Nails x 10 are well maintained; remaining integument appears unremarkable at this time. There are no open sores, no preulcerative lesions, no rash or signs of infection present.  Toenails are thick yellow dystrophic clinically mycotic 2 through 5 bilateral  Vascular: Dorsalis Pedis artery and Posterior Tibial artery pedal pulses are 2/4 bilateral with immedate capillary fill time. Pedal hair growth present. No varicosities and no lower extremity edema present bilateral.   Neruologic: Grossly intact via light touch bilateral. Vibratory intact via tuning fork bilateral. Protective threshold with Semmes Wienstein monofilament intact to all pedal sites bilateral. Patellar and Achilles deep tendon reflexes 2+ bilateral. No Babinski or clonus noted bilateral.   Musculoskeletal: No gross boney pedal deformities bilateral. No pain, crepitus, or limitation noted with foot and ankle range of motion bilateral. Muscular strength 5/5 in all groups tested bilateral.  Gait: Unassisted, Nonantalgic.    Radiographs:  None taken  Assessment & Plan:   Assessment: Pain in limb secondary onychomycosis 2 through 5.  Plan: Samples of the skin and nail were taken today for pathologic evaluation we will follow-up with him once that returns.     Jilliana Burkes T. [place], [place]

## 2020-01-21 ENCOUNTER — Other Ambulatory Visit: Payer: Self-pay | Admitting: Urology

## 2020-01-27 ENCOUNTER — Ambulatory Visit: Payer: 59 | Admitting: Podiatry

## 2020-01-27 ENCOUNTER — Encounter: Payer: Self-pay | Admitting: Podiatry

## 2020-01-27 ENCOUNTER — Other Ambulatory Visit: Payer: Self-pay

## 2020-01-27 DIAGNOSIS — Z79899 Other long term (current) drug therapy: Secondary | ICD-10-CM

## 2020-01-27 DIAGNOSIS — L603 Nail dystrophy: Secondary | ICD-10-CM | POA: Diagnosis not present

## 2020-01-27 MED ORDER — TERBINAFINE HCL 250 MG PO TABS: 250.0000 mg | ORAL_TABLET | Freq: Every day | ORAL | 0 refills | Status: DC

## 2020-01-27 NOTE — Patient Instructions (Signed)
Terbinafine tablets What is this medicine? TERBINAFINE (TER bin a feen) is an antifungal medicine. It is used to treat certain kinds of fungal or yeast infections. This medicine may be used for other purposes; ask your health care provider or pharmacist if you have questions. COMMON BRAND NAME(S): Lamisil, Terbinex What should I tell my health care provider before I take this medicine? They need to know if you have any of these conditions:  drink alcoholic beverages  kidney disease  liver disease  an unusual or allergic reaction to terbinafine, other medicines, foods, dyes, or preservatives  pregnant or trying to get pregnant  breast-feeding How should I use this medicine? Take this medicine by mouth with a full glass of water. Follow the directions on the prescription label. You can take this medicine with food or on an empty stomach. Take your medicine at regular intervals. Do not take your medicine more often than directed. Do not skip doses or stop your medicine early even if you feel better. Do not stop taking except on your doctor's advice. Talk to your pediatrician regarding the use of this medicine in children. Special care may be needed. Overdosage: If you think you have taken too much of this medicine contact a poison control center or emergency room at once. NOTE: This medicine is only for you. Do not share this medicine with others. What if I miss a dose? If you miss a dose, take it as soon as you can. If it is almost time for your next dose, take only that dose. Do not take double or extra doses. What may interact with this medicine? Do not take this medicine with any of the following medications:  thioridazine This medicine may also interact with the following medications:  beta-blockers  caffeine  cimetidine  cyclosporine  medicines for depression, anxiety, or psychotic disturbances  medicines for fungal infections like fluconazole and ketoconazole  medicines  for irregular heartbeat like amiodarone, flecainide and propafenone  rifampin  warfarin This list may not describe all possible interactions. Give your health care provider a list of all the medicines, herbs, non-prescription drugs, or dietary supplements you use. Also tell them if you smoke, drink alcohol, or use illegal drugs. Some items may interact with your medicine. What should I watch for while using this medicine? Visit your doctor or health care provider regularly. Tell your doctor right away if you have nausea or vomiting, loss of appetite, stomach pain on your right upper side, yellow skin, dark urine, light stools, or are over tired. Some fungal infections need many weeks or months of treatment to cure. If you are taking this medicine for a long time, you will need to have important blood work done. This medicine may cause serious skin reactions. They can happen weeks to months after starting the medicine. Contact your health care provider right away if you notice fevers or flu-like symptoms with a rash. The rash may be red or purple and then turn into blisters or peeling of the skin. Or, you might notice a red rash with swelling of the face, lips or lymph nodes in your neck or under your arms. What side effects may I notice from receiving this medicine? Side effects that you should report to your doctor or health care professional as soon as possible:  allergic reactions like skin rash or hives, swelling of the face, lips, or tongue  changes in vision  dark urine  fever or infection  general ill feeling or flu-like symptoms    light-colored stools  loss of appetite, nausea  rash, fever, and swollen lymph nodes  redness, blistering, peeling or loosening of the skin, including inside the mouth  right upper belly pain  unusually weak or tired  yellowing of the eyes or skin Side effects that usually do not require medical attention (report to your doctor or health care  professional if they continue or are bothersome):  changes in taste  diarrhea  hair loss  muscle or joint pain  stomach gas  stomach upset This list may not describe all possible side effects. Call your doctor for medical advice about side effects. You may report side effects to FDA at 1-800-FDA-1088. Where should I keep my medicine? Keep out of the reach of children. Store at room temperature below 25 degrees C (77 degrees F). Protect from light. Throw away any unused medicine after the expiration date. NOTE: This sheet is a summary. It may not cover all possible information. If you have questions about this medicine, talk to your doctor, pharmacist, or health care provider.  2020 Elsevier/Gold Standard (2019-02-08 15:37:07)  

## 2020-01-27 NOTE — Progress Notes (Signed)
He presents today for follow-up of his nail pathology.  Relates no change.  Objective: Pathology demonstrates T rubrum infection of the nail plate.  Assessment: Onychomycosis.  Plan: Discussed etiology pathology conservative surgical therapies we did discuss laser therapy topical therapy and oral therapy.  At this point he would like to consider oral therapy.  We started him on Lamisil 250 mg tablets 1 p.o. daily.  We will also request a liver profile.  Should this come back abnormal I will notify him immediately otherwise I will follow-up with him in 30 days for second liver profile.

## 2020-01-28 LAB — HEPATIC FUNCTION PANEL
ALT: 18 IU/L (ref 0–44)
AST: 24 IU/L (ref 0–40)
Albumin: 4.6 g/dL (ref 3.8–4.9)
Alkaline Phosphatase: 56 IU/L (ref 39–117)
Bilirubin Total: 0.4 mg/dL (ref 0.0–1.2)
Bilirubin, Direct: 0.09 mg/dL (ref 0.00–0.40)
Total Protein: 6.7 g/dL (ref 6.0–8.5)

## 2020-02-01 ENCOUNTER — Ambulatory Visit: Payer: Self-pay | Attending: Internal Medicine

## 2020-02-01 DIAGNOSIS — Z23 Encounter for immunization: Secondary | ICD-10-CM

## 2020-02-04 ENCOUNTER — Other Ambulatory Visit: Payer: Self-pay | Admitting: Urology

## 2020-02-22 ENCOUNTER — Ambulatory Visit: Payer: Self-pay | Attending: Internal Medicine

## 2020-02-22 DIAGNOSIS — Z23 Encounter for immunization: Secondary | ICD-10-CM

## 2020-03-04 ENCOUNTER — Encounter: Payer: Self-pay | Admitting: Podiatry

## 2020-03-04 ENCOUNTER — Other Ambulatory Visit: Payer: Self-pay

## 2020-03-04 ENCOUNTER — Ambulatory Visit: Payer: 59 | Admitting: Podiatry

## 2020-03-04 VITALS — Temp 97.8°F

## 2020-03-04 DIAGNOSIS — Z79899 Other long term (current) drug therapy: Secondary | ICD-10-CM

## 2020-03-04 DIAGNOSIS — L603 Nail dystrophy: Secondary | ICD-10-CM

## 2020-03-04 MED ORDER — TERBINAFINE HCL 250 MG PO TABS: 250.0000 mg | ORAL_TABLET | Freq: Every day | ORAL | 0 refills | Status: DC

## 2020-03-04 NOTE — Progress Notes (Signed)
He presents today for his Lamisil check he is completed his first 30 tablets denies fever chills nausea vomiting muscle aches pains calf pain back pain chest pain shortness of breath.  Objective: No change in the toenails as of yet there is a tinea pedis appears to be resolving.  Assessment: Long-term therapy for onychomycosis with Lamisil.  Long-term therapy with Lamisil for tinea pedis.  Plan: Requesting another liver profile to be taken today.  Sent to [facility] for this.  I went ahead and refilled his Lamisil 90 tablets 1 p.o. daily x90 days and I will follow-up with him in 4 months.

## 2020-03-05 LAB — HEPATIC FUNCTION PANEL
ALT: 19 IU/L (ref 0–44)
AST: 24 IU/L (ref 0–40)
Albumin: 4.5 g/dL (ref 3.8–4.9)
Alkaline Phosphatase: 57 IU/L (ref 39–117)
Bilirubin Total: 0.4 mg/dL (ref 0.0–1.2)
Bilirubin, Direct: 0.12 mg/dL (ref 0.00–0.40)
Total Protein: 6.4 g/dL (ref 6.0–8.5)

## 2020-03-09 ENCOUNTER — Telehealth: Payer: Self-pay

## 2020-03-09 NOTE — Telephone Encounter (Signed)
-----   Message from Garrel Ridgel, [place] sent at 03/09/2020  7:01 AM EDT ----- Blood work looks perfect. He may continue his medication.

## 2020-03-09 NOTE — Telephone Encounter (Signed)
Patient notified of results and instructed to continue medication per Dr. Stephenie Acres instructions.

## 2020-07-08 ENCOUNTER — Encounter: Payer: 59 | Admitting: Podiatry

## 2020-07-13 ENCOUNTER — Ambulatory Visit: Payer: 59 | Admitting: Urology

## 2020-07-15 ENCOUNTER — Ambulatory Visit: Payer: 59 | Admitting: Urology

## 2020-07-15 ENCOUNTER — Other Ambulatory Visit: Payer: Self-pay

## 2020-07-15 ENCOUNTER — Encounter: Payer: Self-pay | Admitting: Urology

## 2020-07-15 VITALS — BP 125/78 | HR 70 | Ht 73.0 in | Wt 235.0 lb

## 2020-07-15 DIAGNOSIS — N401 Enlarged prostate with lower urinary tract symptoms: Secondary | ICD-10-CM

## 2020-07-15 DIAGNOSIS — N5201 Erectile dysfunction due to arterial insufficiency: Secondary | ICD-10-CM | POA: Diagnosis not present

## 2020-07-15 LAB — BLADDER SCAN AMB NON-IMAGING: Scan Result: 41

## 2020-07-15 MED ORDER — SILDENAFIL CITRATE 20 MG PO TABS: 20.0000 mg | ORAL_TABLET | Freq: Every day | ORAL | 1 refills | Status: DC | PRN

## 2020-07-15 NOTE — Progress Notes (Signed)
07/15/2020 11:19 AM   Nathan Herman October 28, 1961 [id number]  Referring provider: Leonel Ramsay, MD [place],  [place] [address]  Chief Complaint  Patient presents with  . Benign Prostatic Hypertrophy    HPI: 59 y.o. male presents for annual follow-up of BPH and erectile dysfunction.   No problems since last years visit  Stable lower urinary tract symptoms which are mild and not bothersome  Denies dysuria, gross hematuria  Denies flank, abdominal or pelvic pain  Sildenafil/tadalafil effective for ED   PMH: Past Medical History:  Diagnosis Date  . Arthritis    big toe  . History of kidney stones     Surgical History: Past Surgical History:  Procedure Laterality Date  . HERNIA REPAIR     umbilical  . SHOULDER ARTHROSCOPY WITH OPEN ROTATOR CUFF REPAIR Right 12/19/2017   Procedure: SHOULDER ARTHROSCOPY WITH OPEN ROTATOR CUFF REPAIR;  Surgeon: Corky Mull, MD;  Location: [facility] ORS;  Service: Orthopedics;  Laterality: Right;  . SHOULDER ARTHROSCOPY WITH SUBACROMIAL DECOMPRESSION AND BICEP TENDON REPAIR Left 11/20/2018   Procedure: SHOULDER ARTHROSCOPY WITH DEBRIDEMENT, DECOMPRESSION VERSUS REPAIR OF ROTATOR CUFF TEAR AND EXCISION OF BENIGN SOFT TISSUE MASS IN THE ANTERIOLATERAL ASPECT OF LEFT SHOULDER;  Surgeon: Corky Mull, MD;  Location: [facility] OR[facility];  Service: Orthopedics;  Laterality: Left;    Home Medications:  Allergies as of 07/15/2020   No Known Allergies     Medication List       Accurate as of July 15, 2020 11:19 AM. If you have any questions, ask your nurse or doctor.        cholecalciferol 1000 units tablet Commonly known as: VITAMIN D Take 1,000 Units by mouth daily.   KRILL OIL PO Take 1 tablet by mouth 2 (two) times a week. TAKES ON MONDAYS AND THURSDAYS   Multi-Vitamin tablet Take by mouth.   omega-3 acid ethyl esters 1 g capsule Commonly known as: LOVAZA Take 1 g by mouth daily.   sildenafil 20 MG  tablet Commonly known as: REVATIO TAKE 1 TABLET (20 MG TOTAL) BY MOUTH DAILY AS NEEDED (ED).   terbinafine 250 MG tablet Commonly known as: LamISIL Take 1 tablet (250 mg total) by mouth daily.   vitamin E 180 MG (400 UNITS) capsule Take 400 Units by mouth daily.       Allergies: No Known Allergies  Family History: History reviewed. No pertinent family history.  Social History:  reports that he quit smoking about 7 years ago. His smoking use included cigars. He quit after 8.00 years of use. He has never used smokeless tobacco. He reports that he does not drink alcohol and does not use drugs.   Physical Exam: BP 125/78   Pulse 70   Ht 6\' 1"  (1.854 m)   Wt 235 lb (106.6 kg)   BMI 31.00 kg/m   Constitutional:  Alert and oriented, No acute distress. HEENT: [place] AT, moist mucus membranes.  Trachea midline, no masses. Cardiovascular: No clubbing, cyanosis, or edema. Respiratory: Normal respiratory effort, no increased work of breathing. GU: Prostate 45 g, smooth without nodules Lymph: No cervical or inguinal lymphadenopathy. Skin: No rashes, bruises or suspicious lesions. Neurologic: Grossly intact, no focal deficits, moving all 4 extremities. Psychiatric: Normal mood and affect.   Assessment & Plan:    1. Benign prostatic hyperplasia with lower urinary tract symptoms  Mild symptoms which are stable and not bothersome  PVR by bladder scan 41 mL  Desires to continue  prostate cancer screening and PSA ordered  2.  Erectile dysfunction  Uses sildenafil primarily and tadalafil on occasions  Requesting refills of both  Continue annual follow-up   Abbie Sons, MD  [facility] [address] [place], [place] [address] [phone]

## 2020-07-16 ENCOUNTER — Telehealth: Payer: Self-pay | Admitting: *Deleted

## 2020-07-16 LAB — PSA: Prostate Specific Ag, Serum: 1.6 ng/mL (ref 0.0–4.0)

## 2020-07-16 NOTE — Telephone Encounter (Signed)
-----   Message from Abbie Sons, MD sent at 07/16/2020  7:43 AM EDT ----- Testosterone level stable 1.6.  Follow-up as scheduled

## 2020-07-17 NOTE — Telephone Encounter (Signed)
Patient seen in my chart

## 2020-07-20 ENCOUNTER — Encounter: Payer: Self-pay | Admitting: Urology

## 2020-07-29 ENCOUNTER — Other Ambulatory Visit: Payer: Self-pay

## 2020-07-29 ENCOUNTER — Ambulatory Visit (INDEPENDENT_AMBULATORY_CARE_PROVIDER_SITE_OTHER): Payer: 59 | Admitting: Podiatry

## 2020-07-29 ENCOUNTER — Encounter: Payer: Self-pay | Admitting: Podiatry

## 2020-07-29 DIAGNOSIS — L603 Nail dystrophy: Secondary | ICD-10-CM | POA: Diagnosis not present

## 2020-07-29 DIAGNOSIS — Z79899 Other long term (current) drug therapy: Secondary | ICD-10-CM | POA: Diagnosis not present

## 2020-07-29 MED ORDER — TERBINAFINE HCL 250 MG PO TABS: 250.0000 mg | ORAL_TABLET | Freq: Every day | ORAL | 0 refills | Status: DC

## 2020-07-29 NOTE — Progress Notes (Signed)
He presents today for follow-up of his Lamisil therapy is completed 120 days.  He states that he is doing very well.  Objective: Vital signs are stable he is alert oriented x3 nail plates have not grown out yet though his tinea pedis has resolved completely.  Assessment: Slow healing onychomycosis.  Plan: I will give him 60 pills he will take that over the next 120 days 1 tablet every other day.  I will follow-up with him in 3 months just for evaluation.  No new prescription will need to be written at that time

## 2020-08-15 ENCOUNTER — Other Ambulatory Visit: Payer: Self-pay | Admitting: Urology

## 2020-08-18 ENCOUNTER — Telehealth: Payer: Self-pay | Admitting: Family Medicine

## 2020-08-18 NOTE — Telephone Encounter (Signed)
Patient left message states he needs a refill on medication. A refill for the Sildenafil was sent to pharmacy on 08/16/2020. Called patient and left a message stating a refill was at his pharmacy. He is contact our office if needed.

## 2020-08-26 NOTE — Telephone Encounter (Signed)
Patient called the office today.   He is requesting a prescription for Cialis 5mg  to be [facility] to [facility]. Patient takes both Cialis and Viagra.   Please call patient [phone] to confirm

## 2020-08-26 NOTE — Telephone Encounter (Signed)
Cialis has not been prescribed since 06/2019 30 with no refills. Is he to be taking this medication? It is on his discontinued list. He states he is taking both Cialis ans Sildenafil

## 2020-08-28 NOTE — Telephone Encounter (Signed)
Called into pharmacy

## 2020-08-28 NOTE — Telephone Encounter (Signed)
It is okay to refill

## 2020-09-09 ENCOUNTER — Other Ambulatory Visit: Payer: Self-pay

## 2020-09-09 ENCOUNTER — Ambulatory Visit (INDEPENDENT_AMBULATORY_CARE_PROVIDER_SITE_OTHER): Payer: 59 | Admitting: Podiatry

## 2020-09-09 ENCOUNTER — Encounter: Payer: Self-pay | Admitting: Podiatry

## 2020-09-09 DIAGNOSIS — L600 Ingrowing nail: Secondary | ICD-10-CM | POA: Diagnosis not present

## 2020-09-09 MED ORDER — NEOMYCIN-POLYMYXIN-HC 1 % OT SOLN: OTIC | 1 refills | Status: DC

## 2020-09-09 NOTE — Patient Instructions (Signed)

## 2020-09-09 NOTE — Progress Notes (Signed)
Nathan Herman presents today states that since he is unable to take Lamisil or itraconazole secondary to the side effects he would like to have toenails 2 through 5 removed on both feet.  He states that these are thick and painful he states that he cannot stand wearing shoes and socks any longer with these nails this way and they are impossible for him to trim and maintain.  Objective: Vital signs are stable alert and oriented x3.  Pulses are palpable.  There is thick yellow dystrophic clinically mycotic painful nails.  No purulence no malodor to toenails of 2 through 5 bilaterally.  Assessment is painful toenails 2 through 5 bilaterally.  Plan: At this point I recommended that we do 1 foot at a time so we started with the right foot today localizing each toe 2 through 5 of the right foot a total of 9 cc of a 50-50 mixture of Marcaine plain and lidocaine plain was utilized he tolerated the procedure well chemical matrixectomy's were performed to those toes total in nature.  He was given both oral and written home-going structure for care and soaking of the toe as well as a prescription for Cortisporin Otic and I will follow-up with him in 2 weeks.  At that time we will do the other foot.

## 2020-09-22 ENCOUNTER — Other Ambulatory Visit: Payer: Self-pay | Admitting: Urology

## 2020-09-23 ENCOUNTER — Other Ambulatory Visit: Payer: Self-pay

## 2020-09-23 ENCOUNTER — Ambulatory Visit (INDEPENDENT_AMBULATORY_CARE_PROVIDER_SITE_OTHER): Payer: BC Managed Care – PPO | Admitting: Podiatry

## 2020-09-23 ENCOUNTER — Encounter: Payer: Self-pay | Admitting: Podiatry

## 2020-09-23 DIAGNOSIS — L600 Ingrowing nail: Secondary | ICD-10-CM

## 2020-09-23 DIAGNOSIS — Z9889 Other specified postprocedural states: Secondary | ICD-10-CM

## 2020-09-23 NOTE — Progress Notes (Signed)
He presents today for follow-up of nail avulsions matrixectomy's toes #2 #3 #4 #5 over the right foot.  States that is doing quite well no problems no complications though it is quite tender.  I am not sure that I will redo the left foot at this point.  Objective: Vital signs are stable alert and oriented x3 there is no erythema edema cellulitis drainage or other epithelialization is occurring no purulence no signs of infection.  Assessment: Slowly resolving matrixectomy's lesser toes right foot.  Plan: Continue to soak Epson salts and warm water cover during the day leave open at bedtime.  Follow-up with me in a couple weeks if not completely resolved.

## 2020-10-09 ENCOUNTER — Other Ambulatory Visit: Payer: Self-pay | Admitting: Urology

## 2020-10-28 ENCOUNTER — Ambulatory Visit: Payer: 59 | Admitting: Podiatry

## 2021-07-16 ENCOUNTER — Other Ambulatory Visit: Payer: 59

## 2021-07-16 ENCOUNTER — Other Ambulatory Visit: Payer: Self-pay

## 2021-07-16 DIAGNOSIS — N401 Enlarged prostate with lower urinary tract symptoms: Secondary | ICD-10-CM

## 2021-07-17 LAB — PSA: Prostate Specific Ag, Serum: 2.1 ng/mL (ref 0.0–4.0)

## 2021-07-21 ENCOUNTER — Ambulatory Visit (INDEPENDENT_AMBULATORY_CARE_PROVIDER_SITE_OTHER): Payer: Self-pay | Admitting: Urology

## 2021-07-21 ENCOUNTER — Other Ambulatory Visit: Payer: Self-pay

## 2021-07-21 ENCOUNTER — Encounter: Payer: Self-pay | Admitting: Urology

## 2021-07-21 VITALS — BP 151/76 | HR 64 | Ht 72.0 in | Wt 240.0 lb

## 2021-07-21 DIAGNOSIS — Z125 Encounter for screening for malignant neoplasm of prostate: Secondary | ICD-10-CM

## 2021-07-21 DIAGNOSIS — N401 Enlarged prostate with lower urinary tract symptoms: Secondary | ICD-10-CM

## 2021-07-21 DIAGNOSIS — N5201 Erectile dysfunction due to arterial insufficiency: Secondary | ICD-10-CM

## 2021-07-21 LAB — BLADDER SCAN AMB NON-IMAGING: SCA Result: 0

## 2021-07-21 MED ORDER — TADALAFIL 5 MG PO TABS: ORAL_TABLET | ORAL | 11 refills | Status: DC

## 2021-07-21 NOTE — Progress Notes (Addendum)
07/21/2021 11:25 AM   Nathan Herman Feb 02, 1961 [id number]  Referring provider: Leonel Ramsay, MD [place],  [place] [address]  Chief Complaint  Patient presents with   Benign Prostatic Hypertrophy    Urologic history: 1.  BPH with LUTS Mild lower urinary tract symptoms No medical management  2.  Erectile dysfunction PDE 5 inhibitor therapy Uses both sildenafil and tadalafil however not together   HPI: 60 y.o. male presents for annual follow-up.  Doing well since last visit No bothersome LUTS He did start taking saw palmetto and feels his voiding symptoms have improved Denies dysuria, gross hematuria Denies flank, abdominal or pelvic pain PSA 07/16/2021 slightly elevated above baseline at 2.1     PDE 5 inhibitor therapy remains effective for his ED.  Uses tadalafil more often than sildenafil  PMH: Past Medical History:  Diagnosis Date   Arthritis    big toe   History of kidney stones     Surgical History: Past Surgical History:  Procedure Laterality Date   HERNIA REPAIR     umbilical   SHOULDER ARTHROSCOPY WITH OPEN ROTATOR CUFF REPAIR Right 12/19/2017   Procedure: SHOULDER ARTHROSCOPY WITH OPEN ROTATOR CUFF REPAIR;  Surgeon: Corky Mull, MD;  Location: [facility];  Service: Orthopedics;  Laterality: Right;   SHOULDER ARTHROSCOPY WITH SUBACROMIAL DECOMPRESSION AND BICEP TENDON REPAIR Left 11/20/2018   Procedure: SHOULDER ARTHROSCOPY WITH DEBRIDEMENT, DECOMPRESSION VERSUS REPAIR OF ROTATOR CUFF TEAR AND EXCISION OF BENIGN SOFT TISSUE MASS IN THE ANTERIOLATERAL ASPECT OF LEFT SHOULDER;  Surgeon: Corky Mull, MD;  Location: [facility];  Service: Orthopedics;  Laterality: Left;    Home Medications:  Allergies as of 07/21/2021   No Known Allergies      Medication List        Accurate as of July 21, 2021 11:25 AM. If you have any questions, ask your nurse or doctor.          cholecalciferol 1000 units tablet Commonly known as:  VITAMIN D Take 1,000 Units by mouth daily.   KRILL OIL PO Take 1 tablet by mouth 2 (two) times a week. TAKES ON MONDAYS AND THURSDAYS   Multi-Vitamin tablet Take by mouth.   NEOMYCIN-POLYMYXIN-HYDROCORTISONE 1 % Soln OTIC solution Commonly known as: CORTISPORIN Apply 1-2 drops to toe BID after soaking   omega-3 acid ethyl esters 1 g capsule Commonly known as: LOVAZA Take 1 g by mouth daily.   sildenafil 20 MG tablet Commonly known as: REVATIO TAKE 1 TABLET (20 MG TOTAL) BY MOUTH DAILY AS NEEDED (ED).   tadalafil 5 MG tablet Commonly known as: CIALIS TAKE 1 TABLET BY MOUTH AS NEEDED FOR ED   vitamin E 180 MG (400 UNITS) capsule Take 400 Units by mouth daily.        Allergies: No Known Allergies  Family History: No family history on file.  Social History:  reports that he quit smoking about 8 years ago. His smoking use included cigars. He has never used smokeless tobacco. He reports that he does not drink alcohol and does not use drugs.   Physical Exam: BP (!) 151/76   Pulse 64   Ht 6' (1.829 m)   Wt 240 lb (108.9 kg)   BMI 32.55 kg/m   Constitutional:  Alert and oriented, No acute distress. HEENT: [place] AT, moist mucus membranes.  Trachea midline, no masses. Cardiovascular: No clubbing, cyanosis, or edema. Respiratory: Normal respiratory effort, no increased work of breathing. GU: Prostate 45 g,  smooth without nodules Psychiatric: Normal mood and affect.   Assessment & Plan:    1. Benign prostatic hyperplasia with LUTS Mild lower urinary tract symptoms-improved on saw palmetto Bladder scan PVR 0 mL UA today normal  2.  Erectile dysfunction Stable Tadalafil refilled  3.  Prostate cancer screening Benign DRE PSA slightly elevated above baseline however within an acceptable range He desired a follow-up PSA in 6 months to ensure the PSA is not continuing to rise Continue annual follow-up    Abbie Sons, MD  [facility] [address], [place], [place] [address] [phone]

## 2021-07-22 LAB — MICROSCOPIC EXAMINATION
Bacteria, UA: NONE SEEN
RBC, Urine: NONE SEEN /hpf (ref 0–2)

## 2021-07-22 LAB — URINALYSIS, COMPLETE
Bilirubin, UA: NEGATIVE
Glucose, UA: NEGATIVE
Ketones, UA: NEGATIVE
Leukocytes,UA: NEGATIVE
Nitrite, UA: NEGATIVE
Protein,UA: NEGATIVE
RBC, UA: NEGATIVE
Specific Gravity, UA: 1.02 (ref 1.005–1.030)
Urobilinogen, Ur: 0.2 mg/dL (ref 0.2–1.0)
pH, UA: 7 (ref 5.0–7.5)

## 2021-09-01 ENCOUNTER — Other Ambulatory Visit: Payer: Self-pay

## 2021-09-01 ENCOUNTER — Ambulatory Visit (INDEPENDENT_AMBULATORY_CARE_PROVIDER_SITE_OTHER): Payer: BC Managed Care – PPO | Admitting: Podiatry

## 2021-09-01 ENCOUNTER — Encounter: Payer: Self-pay | Admitting: Podiatry

## 2021-09-01 DIAGNOSIS — L600 Ingrowing nail: Secondary | ICD-10-CM | POA: Diagnosis not present

## 2021-09-01 MED ORDER — NEOMYCIN-POLYMYXIN-HC 1 % OT SOLN: OTIC | 1 refills | Status: DC

## 2021-09-01 NOTE — Patient Instructions (Addendum)
Get lamisil AT OTC and [facility] foot cream   Betadine Soak Instructions  Purchase an 8 oz. bottle of BETADINE solution (Povidone)  THE DAY AFTER THE PROCEDURE  Place 1 tablespoon of betadine solution in a quart of warm tap water.  Submerge your foot or feet with outer bandage intact for the initial soak; this will allow the bandage to become moist and wet for easy lift off.  Once you remove your bandage, continue to soak in the solution for 20 minutes.  This soak should be done twice a day.  Next, remove your foot or feet from solution, blot dry the affected area and cover.  You may use a band aid large enough to cover the area or use gauze and tape.  Apply other medications to the area as directed by the doctor such as cortisporin otic solution (ear drops) or neosporin.  IF YOUR SKIN BECOMES IRRITATED WHILE USING THESE INSTRUCTIONS, IT IS OKAY TO SWITCH TO EPSOM SALTS AND WATER OR WHITE VINEGAR AND WATER.   [place] Instructions-Post Nail Surgery  You have had your ingrown toenail and root treated with a chemical.  This chemical causes a burn that will drain and ooze like a blister.  This can drain for 6-8 weeks or longer.  It is important to keep this area clean, covered, and follow the soaking instructions dispensed at the time of your surgery.  This area will eventually dry and form a scab.  Once the scab forms you no longer need to soak or apply a dressing.  If at any time you experience an increase in pain, redness, swelling, or drainage, you should contact the office as soon as possible.

## 2021-09-01 NOTE — Progress Notes (Signed)
He presents today chief complaint of painful toenails #2 #3 #4 #5 of the left foot.  He has tried Lamisil in the past to no avail still develops itching between his toes and on his toes.  He would like to get rid of the toenails on the left foot like we did on the right foot he states the right foot is doing much better.  Objective: Vital signs are stable alert oriented x3 there is no erythema edema cellulitis drainage or odor toenails are thick yellow dystrophic with mycotic painful palpation as well as debridement.  Assessment: Painful nails to toes #2 #3 #4 #5 left foot.  Plan: Chemical matricectomy's were performed today after local anesthetic was administered he tolerated procedure well was provided with oral written home-going instruction for the care and soaking of all these toes.  He also received a prescription for [place] otic which she will apply twice daily after soaking twice daily.  Follow-up with him in 2 weeks

## 2021-09-20 ENCOUNTER — Other Ambulatory Visit: Payer: Self-pay

## 2021-09-20 ENCOUNTER — Encounter: Payer: Self-pay | Admitting: Podiatry

## 2021-09-20 ENCOUNTER — Ambulatory Visit (INDEPENDENT_AMBULATORY_CARE_PROVIDER_SITE_OTHER): Payer: BC Managed Care – PPO | Admitting: Podiatry

## 2021-09-20 DIAGNOSIS — L600 Ingrowing nail: Secondary | ICD-10-CM

## 2021-09-20 DIAGNOSIS — Z9889 Other specified postprocedural states: Secondary | ICD-10-CM

## 2021-09-20 NOTE — Progress Notes (Signed)
He presents today for follow-up of his matricectomy's toes 2 through 5 left foot.  States that they have been doing all right though these were little more painful than the previous ones.  Objective: Vital signs are stable he is alert and oriented x3 there is no erythema edema cellulitis drainage or odor granulation tissue is present appears to be healing with epithelization.  Assessment: Well-healing surgical toes 2 through 5 left foot.  Plan: Encouraged him to soak Epson salts and warm water covered in the daytime leave open at bedtime.  Follow-up with me in 1 month if not completely healed.

## 2021-10-20 ENCOUNTER — Ambulatory Visit: Payer: BC Managed Care – PPO | Admitting: Podiatry

## 2022-01-18 ENCOUNTER — Other Ambulatory Visit: Payer: BC Managed Care – PPO

## 2022-01-18 ENCOUNTER — Other Ambulatory Visit: Payer: Self-pay

## 2022-01-18 DIAGNOSIS — N401 Enlarged prostate with lower urinary tract symptoms: Secondary | ICD-10-CM

## 2022-01-19 LAB — PSA: Prostate Specific Ag, Serum: 1.8 ng/mL (ref 0.0–4.0)

## 2022-03-28 ENCOUNTER — Other Ambulatory Visit: Payer: Self-pay | Admitting: Student

## 2022-03-28 DIAGNOSIS — R29898 Other symptoms and signs involving the musculoskeletal system: Secondary | ICD-10-CM

## 2022-07-21 ENCOUNTER — Other Ambulatory Visit: Payer: BC Managed Care – PPO

## 2022-07-21 DIAGNOSIS — N401 Enlarged prostate with lower urinary tract symptoms: Secondary | ICD-10-CM

## 2022-07-22 LAB — PSA: Prostate Specific Ag, Serum: 1.9 ng/mL (ref 0.0–4.0)

## 2022-07-25 ENCOUNTER — Ambulatory Visit: Payer: Self-pay | Admitting: Urology

## 2022-07-27 ENCOUNTER — Encounter: Payer: Self-pay | Admitting: Urology

## 2022-07-27 ENCOUNTER — Ambulatory Visit (INDEPENDENT_AMBULATORY_CARE_PROVIDER_SITE_OTHER): Payer: BC Managed Care – PPO | Admitting: Urology

## 2022-07-27 VITALS — BP 144/88 | HR 67 | Ht 72.0 in | Wt 238.0 lb

## 2022-07-27 DIAGNOSIS — N401 Enlarged prostate with lower urinary tract symptoms: Secondary | ICD-10-CM

## 2022-07-27 DIAGNOSIS — Z125 Encounter for screening for malignant neoplasm of prostate: Secondary | ICD-10-CM | POA: Diagnosis not present

## 2022-07-27 DIAGNOSIS — N5201 Erectile dysfunction due to arterial insufficiency: Secondary | ICD-10-CM | POA: Diagnosis not present

## 2022-07-27 LAB — URINALYSIS, COMPLETE
Bilirubin, UA: NEGATIVE
Glucose, UA: NEGATIVE
Ketones, UA: NEGATIVE
Leukocytes,UA: NEGATIVE
Nitrite, UA: NEGATIVE
Protein,UA: NEGATIVE
RBC, UA: NEGATIVE
Specific Gravity, UA: 1.02 (ref 1.005–1.030)
Urobilinogen, Ur: 0.2 mg/dL (ref 0.2–1.0)
pH, UA: 5.5 (ref 5.0–7.5)

## 2022-07-27 LAB — MICROSCOPIC EXAMINATION: Bacteria, UA: NONE SEEN

## 2022-07-27 LAB — BLADDER SCAN AMB NON-IMAGING: Scan Result: 0

## 2022-07-27 MED ORDER — TADALAFIL 5 MG PO TABS: ORAL_TABLET | ORAL | 11 refills | Status: DC

## 2022-07-27 NOTE — Progress Notes (Signed)
07/27/2022 10:32 AM   Nitish L Koerber 01/08/1961 [id number]  Referring provider: Leonel Ramsay, MD [place],  [place] [address]  Chief Complaint  Patient presents with   Benign Prostatic Hypertrophy    Urologic history: 1.  BPH with LUTS Mild lower urinary tract symptoms No medical management  2.  Erectile dysfunction PDE 5 inhibitor therapy Tadalafil   HPI: 61 y.o. male presents for annual follow-up.  Doing well since last visit No bothersome LUTS Denies dysuria, gross hematuria Denies flank, abdominal or pelvic pain PSA 07/21/2022 stable at 1.9 PDE 5 inhibitor therapy remains effective for his ED. No longer using sildenafil but remains on tadalafil 5 mg  PMH: Past Medical History:  Diagnosis Date   Arthritis    big toe   History of kidney stones     Surgical History: Past Surgical History:  Procedure Laterality Date   HERNIA REPAIR     umbilical   SHOULDER ARTHROSCOPY WITH OPEN ROTATOR CUFF REPAIR Right 12/19/2017   Procedure: SHOULDER ARTHROSCOPY WITH OPEN ROTATOR CUFF REPAIR;  Surgeon: Corky Mull, MD;  Location: [facility];  Service: Orthopedics;  Laterality: Right;   SHOULDER ARTHROSCOPY WITH SUBACROMIAL DECOMPRESSION AND BICEP TENDON REPAIR Left 11/20/2018   Procedure: SHOULDER ARTHROSCOPY WITH DEBRIDEMENT, DECOMPRESSION VERSUS REPAIR OF ROTATOR CUFF TEAR AND EXCISION OF BENIGN SOFT TISSUE MASS IN THE ANTERIOLATERAL ASPECT OF LEFT SHOULDER;  Surgeon: Corky Mull, MD;  Location: [facility];  Service: Orthopedics;  Laterality: Left;    Home Medications:  Allergies as of 07/27/2022   No Known Allergies      Medication List        Accurate as of July 27, 2022 10:32 AM. If you have any questions, ask your nurse or doctor.          STOP taking these medications    sildenafil 20 MG tablet Commonly known as: REVATIO Stopped by: Abbie Sons, MD       TAKE these medications    cholecalciferol 1000 units  tablet Commonly known as: VITAMIN D Take 1,000 Units by mouth daily.   KRILL OIL PO Take 1 tablet by mouth 2 (two) times a week. TAKES ON MONDAYS AND THURSDAYS   Multi-Vitamin tablet Take by mouth.   NEOMYCIN-POLYMYXIN-HYDROCORTISONE 1 % Soln OTIC solution Commonly known as: CORTISPORIN Apply 1-2 drops to toe BID after soaking   omega-3 acid ethyl esters 1 g capsule Commonly known as: LOVAZA Take 1 g by mouth daily.   tadalafil 5 MG tablet Commonly known as: CIALIS TAKE 1 TABLET BY MOUTH AS NEEDED FOR ED   vitamin E 180 MG (400 UNITS) capsule Take 400 Units by mouth daily.        Allergies: No Known Allergies  Family History: History reviewed. No pertinent family history.  Social History:  reports that he quit smoking about 9 years ago. His smoking use included cigars. He has never used smokeless tobacco. He reports that he does not drink alcohol and does not use drugs.   Physical Exam: BP (!) 144/88   Pulse 67   Ht 6' (1.829 m)   Wt 238 lb (108 kg)   BMI 32.28 kg/m   Constitutional:  Alert and oriented, No acute distress. HEENT: [place] AT Respiratory: Normal respiratory effort, no increased work of breathing. GU: Declined DRE today Psychiatric: Normal mood and affect.   Assessment & Plan:    1. Benign prostatic hyperplasia with LUTS Mild lower urinary tract symptoms-improved on saw  palmetto Bladder scan PVR 0 mL UA today normal  2.  Erectile dysfunction Stable Tadalafil refilled  3.  Prostate cancer screening Desires to go to every other year on DRE PSA stable Continue annual follow-up    Abbie Sons, MD  [facility] [address], [place], [place] [address] [phone]

## 2022-08-04 LAB — COLOGUARD: COLOGUARD: NEGATIVE

## 2022-10-12 ENCOUNTER — Other Ambulatory Visit: Payer: Self-pay | Admitting: Urology

## 2023-01-04 ENCOUNTER — Other Ambulatory Visit: Payer: Self-pay | Admitting: Podiatry

## 2023-01-04 ENCOUNTER — Ambulatory Visit (INDEPENDENT_AMBULATORY_CARE_PROVIDER_SITE_OTHER): Payer: BC Managed Care – PPO

## 2023-01-04 ENCOUNTER — Encounter: Payer: Self-pay | Admitting: Podiatry

## 2023-01-04 ENCOUNTER — Ambulatory Visit (INDEPENDENT_AMBULATORY_CARE_PROVIDER_SITE_OTHER): Payer: BC Managed Care – PPO | Admitting: Podiatry

## 2023-01-04 DIAGNOSIS — M778 Other enthesopathies, not elsewhere classified: Secondary | ICD-10-CM

## 2023-01-04 MED ORDER — DEXAMETHASONE SODIUM PHOSPHATE 120 MG/30ML IJ SOLN
2.0000 mg | Freq: Once | INTRAMUSCULAR | Status: AC
  Administered 2023-01-04: 2 mg via INTRA_ARTICULAR

## 2023-01-04 NOTE — Progress Notes (Signed)
He presents today chief complaint of pain to the forefoot left.  He states this been aching for about 2 months states that he is increased the amount of incline on the treadmill he is tried soaking in Epsom salts and taking Motrin and is feels feels like a full-thickness and soreness in the second metatarsal phalangeal joint area as he points to it.  Objective: Vital signs are stable he is alert and oriented x 3.  Pulses are palpable.  There is no erythema to some mild edema around the second metatarsophalangeal joint and mild tenderness on end range of motion.  Radiographs taken today of the joint do not demonstrate any type of osseous abnormalities in the area and no acute findings are noted.  Lateral view does demonstrate some soft tissue swelling.  Assessment: Capsulitis of the second metatarsophalangeal joint left foot.  Plan: Injected the area today 10 mg Kenalog 5 mg Marcaine discussed appropriate shoe gear stretching exercise and ice therapy.  Follow-up with him in the near future may need to consider orthotics.

## 2023-04-18 ENCOUNTER — Encounter: Payer: Self-pay | Admitting: Podiatry

## 2023-05-01 ENCOUNTER — Ambulatory Visit (INDEPENDENT_AMBULATORY_CARE_PROVIDER_SITE_OTHER): Payer: BC Managed Care – PPO | Admitting: Podiatry

## 2023-05-01 ENCOUNTER — Encounter: Payer: Self-pay | Admitting: Podiatry

## 2023-05-01 DIAGNOSIS — M2042 Other hammer toe(s) (acquired), left foot: Secondary | ICD-10-CM | POA: Diagnosis not present

## 2023-05-01 DIAGNOSIS — M2012 Hallux valgus (acquired), left foot: Secondary | ICD-10-CM | POA: Diagnosis not present

## 2023-05-01 MED ORDER — MELOXICAM 15 MG PO TABS: 15.0000 mg | ORAL_TABLET | Freq: Every day | ORAL | 3 refills | Status: DC

## 2023-05-01 NOTE — Progress Notes (Signed)
Nathan Herman presents today for follow-up of his second metatarsophalangeal joint and his bunion deformity.  He states the toe lifted up after that shot back and feels kind of rigid.  I still have the tenderness on the bottom edge of the second knuckle.  And I need to see about getting this bunion fixed is becoming more painful tried anti-inflammatories all to no avail.  Objective: Vital signs stable alert oriented x 3 pulses are palpable there is no erythema there is some mild edema he does have a cocked up hammertoe deformity with rigid contracture at the PIPJ.  This is becoming more arthritic as is his first metatarsal phalangeal joint with an increase in the first intermetatarsal angle greater than normal values and hallux abductus angle greater than normal value with early dislocation.  Hypertrophic medial condyle is prominent and painful on palpation as well as with shoe gear.  Reviewed previous radiographs.  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  Plan: Discussed etiology pathology conservative surgical therapies at this point we will start him on meloxicam 15 mg 1 p.o. daily until surgery can be performed in October.  We went over consent form today line by line number by number giving him ample time to ask questions he is all fit regarding an [place] bunion repair with screw a second metatarsal osteotomy repair with screw and a hammertoe repair second digit with wire left.  We discussed the possible postop complications which may include but are not limited to postop pain bleeding swelling infection recurrence need for further surgery overcorrection under correction loss of digit loss limb loss of life discussed anesthesia type in the anesthesia group.  He understands this is amenable to it we will follow-up with me in the fall for surgery at  [facility] center.

## 2023-07-28 ENCOUNTER — Ambulatory Visit (INDEPENDENT_AMBULATORY_CARE_PROVIDER_SITE_OTHER): Payer: BC Managed Care – PPO | Admitting: Urology

## 2023-07-28 ENCOUNTER — Encounter: Payer: Self-pay | Admitting: Urology

## 2023-07-28 VITALS — BP 171/71 | HR 71 | Ht 72.0 in | Wt 230.0 lb

## 2023-07-28 DIAGNOSIS — N5201 Erectile dysfunction due to arterial insufficiency: Secondary | ICD-10-CM | POA: Diagnosis not present

## 2023-07-28 DIAGNOSIS — N401 Enlarged prostate with lower urinary tract symptoms: Secondary | ICD-10-CM

## 2023-07-28 DIAGNOSIS — Z125 Encounter for screening for malignant neoplasm of prostate: Secondary | ICD-10-CM

## 2023-07-28 LAB — BLADDER SCAN AMB NON-IMAGING: Scan Result: 0

## 2023-07-28 MED ORDER — TADALAFIL 5 MG PO TABS: ORAL_TABLET | ORAL | 11 refills | Status: DC

## 2023-07-28 NOTE — Progress Notes (Signed)
I, Maysun Anabel Bene, acting as a scribe for Riki Altes, MD., have documented all relevant documentation on the behalf of Riki Altes, MD, as directed by Riki Altes, MD while in the presence of Riki Altes, MD.  07/28/2023 3:01 PM   Nathan Herman [birth year] [id number]  Referring provider: Mick Sell, MD [address] [place],  [place] [address]  Chief Complaint  Patient presents with   Benign Prostatic Hypertrophy   Urologic history: 1.  BPH with LUTS Mild lower urinary tract symptoms No medical management   2.  Erectile dysfunction PDE 5 inhibitor therapy Tadalafil  HPI: Nathan Herman is a 62 y.o. male presents for annual follow-up  Doing well since last visit No bothersome LUTS Denies dysuria, gross hematuria Denies flank, abdominal or pelvic pain Current PSA not yet drawn Remains on the tadalafil 5mg  for ED, which has been effective.   PMH: Past Medical History:  Diagnosis Date   Arthritis    big toe   History of kidney stones     Surgical History: Past Surgical History:  Procedure Laterality Date   HERNIA REPAIR     umbilical   SHOULDER ARTHROSCOPY WITH OPEN ROTATOR CUFF REPAIR Right 12/19/2017   Procedure: SHOULDER ARTHROSCOPY WITH OPEN ROTATOR CUFF REPAIR;  Surgeon: Christena Flake, MD;  Location: [facility];  Service: Orthopedics;  Laterality: Right;   SHOULDER ARTHROSCOPY WITH SUBACROMIAL DECOMPRESSION AND BICEP TENDON REPAIR Left 11/20/2018   Procedure: SHOULDER ARTHROSCOPY WITH DEBRIDEMENT, DECOMPRESSION VERSUS REPAIR OF ROTATOR CUFF TEAR AND EXCISION OF BENIGN SOFT TISSUE MASS IN THE ANTERIOLATERAL ASPECT OF LEFT SHOULDER;  Surgeon: Christena Flake, MD;  Location: [facility];  Service: Orthopedics;  Laterality: Left;    Home Medications:  Allergies as of 07/28/2023   No Known Allergies      Medication List        Accurate as of July 28, 2023  3:01 PM. If you have any questions, ask your nurse or doctor.           cholecalciferol 1000 units tablet Commonly known as: VITAMIN D Take 1,000 Units by mouth daily.   KRILL OIL PO Take 1 tablet by mouth 2 (two) times a week. TAKES ON MONDAYS AND THURSDAYS   meloxicam 15 MG tablet Commonly known as: MOBIC Take 1 tablet (15 mg total) by mouth daily.   Multi-Vitamin tablet Take by mouth.   NEOMYCIN-POLYMYXIN-HYDROCORTISONE 1 % Soln OTIC solution Commonly known as: CORTISPORIN Apply 1-2 drops to toe BID after soaking   omega-3 acid ethyl esters 1 g capsule Commonly known as: LOVAZA Take 1 g by mouth daily.   tadalafil 5 MG tablet Commonly known as: CIALIS TAKE 1 TABLET BY MOUTH AS NEEDED FOR ED   vitamin E 180 MG (400 UNITS) capsule Take 400 Units by mouth daily.        Allergies: No Known Allergies   Social History:  reports that he quit smoking about 10 years ago. His smoking use included cigars. He has never used smokeless tobacco. He reports that he does not drink alcohol and does not use drugs.   Physical Exam: BP (!) 171/71   Pulse 71   Ht 6' (1.829 m)   Wt 230 lb (104.3 kg)   BMI 31.19 kg/m   Constitutional:  Alert and oriented, No acute distress. HEENT:  AT, moist mucus membranes.  Trachea midline, no masses. Cardiovascular: No clubbing, cyanosis, or edema. Respiratory: Normal respiratory effort, no  increased work of breathing. GI: Abdomen is soft, nontender, nondistended, no abdominal masses Skin: No rashes, bruises or suspicious lesions. Neurologic: Grossly intact, no focal deficits, moving all 4 extremities. Psychiatric: Normal mood and affect.   Urinalysis Prostate 50 grams, smooth without nodules.  Assessment & Plan:    1. BPH with LUTS Stable Lower urinary track symptoms is the first bullet.   2.  Erectile dysfunction Stable Tadalafil refilled   3.  Prostate cancer screening Denied DRE PSA drawn today  Continue annual follow-up.  [facility] [address] [place], [place] [address] [phone]

## 2023-07-29 ENCOUNTER — Encounter: Payer: Self-pay | Admitting: Urology

## 2023-07-29 LAB — PSA: Prostate Specific Ag, Serum: 3 ng/mL (ref 0.0–4.0)

## 2023-08-02 ENCOUNTER — Telehealth: Payer: Self-pay | Admitting: Podiatry

## 2023-08-02 LAB — URINALYSIS, COMPLETE
Bilirubin, UA: NEGATIVE
Glucose, UA: NEGATIVE
Ketones, UA: NEGATIVE
Leukocytes,UA: NEGATIVE
Nitrite, UA: NEGATIVE
RBC, UA: NEGATIVE
Specific Gravity, UA: 1.02 (ref 1.005–1.030)
Urobilinogen, Ur: 0.2 mg/dL (ref 0.2–1.0)
pH, UA: 7 (ref 5.0–7.5)

## 2023-08-02 LAB — MICROSCOPIC EXAMINATION: Bacteria, UA: NONE SEEN

## 2023-08-02 NOTE — Telephone Encounter (Signed)
DOS-09/01/2023  Nathan Herman [id number] METATARSAL OSTEOTOMY 2ND [id number] HAMMERTOE REPAIR 2ND [id number]  [employer] EFFECTIVE DATE- 03/15/2023  DEDUCTIBLE- $6000.00 WITH REMAINING $5442.51 OOP- $7500.00 WITH REMAINING $6942.51 COINSURANCE- 50%  PER THE [employer], PRIOR AUTHORIZATION HAS BEEN APPROVED FOR CPT CODES 95621,30865 AND 78469. GOOD FROM 09/01/2023 - 10/30/2023  AUTH REFERENCE NUMBER:  [id number].

## 2023-08-30 ENCOUNTER — Other Ambulatory Visit: Payer: Self-pay | Admitting: Podiatry

## 2023-08-30 MED ORDER — CEPHALEXIN 500 MG PO CAPS: 500.0000 mg | ORAL_CAPSULE | Freq: Three times a day (TID) | ORAL | 0 refills | Status: DC

## 2023-08-30 MED ORDER — OXYCODONE-ACETAMINOPHEN 10-325 MG PO TABS
1.0000 | ORAL_TABLET | Freq: Three times a day (TID) | ORAL | 0 refills | Status: AC | PRN
Start: 2023-08-30 — End: 2023-09-06

## 2023-08-30 MED ORDER — ONDANSETRON HCL 4 MG PO TABS: 4.0000 mg | ORAL_TABLET | Freq: Three times a day (TID) | ORAL | 0 refills | Status: DC | PRN

## 2023-09-01 DIAGNOSIS — M2012 Hallux valgus (acquired), left foot: Secondary | ICD-10-CM | POA: Diagnosis not present

## 2023-09-01 DIAGNOSIS — M2042 Other hammer toe(s) (acquired), left foot: Secondary | ICD-10-CM | POA: Diagnosis not present

## 2023-09-01 DIAGNOSIS — M21542 Acquired clubfoot, left foot: Secondary | ICD-10-CM | POA: Diagnosis not present

## 2023-09-07 ENCOUNTER — Ambulatory Visit (INDEPENDENT_AMBULATORY_CARE_PROVIDER_SITE_OTHER): Payer: BC Managed Care – PPO | Admitting: Podiatry

## 2023-09-07 ENCOUNTER — Encounter: Payer: Self-pay | Admitting: Podiatry

## 2023-09-07 ENCOUNTER — Telehealth: Payer: Self-pay | Admitting: Podiatry

## 2023-09-07 ENCOUNTER — Ambulatory Visit (INDEPENDENT_AMBULATORY_CARE_PROVIDER_SITE_OTHER): Payer: BC Managed Care – PPO

## 2023-09-07 VITALS — BP 138/80 | HR 63

## 2023-09-07 DIAGNOSIS — Z9889 Other specified postprocedural states: Secondary | ICD-10-CM | POA: Diagnosis not present

## 2023-09-07 MED ORDER — OXYCODONE-ACETAMINOPHEN 5-325 MG PO TABS: 1.0000 | ORAL_TABLET | ORAL | 0 refills | Status: DC | PRN

## 2023-09-07 NOTE — Telephone Encounter (Signed)
Patient called the office after seeing Dr Allena Katz today post op. Patient states he is having severe back pain since he has been put on this paibn medication. Patient states his back pain is coming from the pain medication and he would like to have something different.

## 2023-09-07 NOTE — Progress Notes (Signed)
Subjective:  Patient ID: Nathan Herman, male    DOB: 1961-11-02,  MRN: [id number]  Chief Complaint  Patient presents with   Routine Post Op    "I've had some shooting pains go down the side of my foot.  It hurts on the tip of my big toe."    DOS: 09/01/2023 Procedure: Austin bunionectomy left foot and second digit hammertoe repair with Weil osteotomy  62 y.o. male returns for post-op check.  Patient states that he is doing well denies any other acute complaints.  Pain is controlled.  He would like some more pain medication bandages clean dry and intact  Review of Systems: Negative except as noted in the HPI. Denies N/V/F/Ch.  Past Medical History:  Diagnosis Date   Arthritis    big toe   History of kidney stones     Current Outpatient Medications:    cephALEXin (KEFLEX) 500 MG capsule, Take 1 capsule (500 mg total) by mouth 3 (three) times daily., Disp: 30 capsule, Rfl: 0   cholecalciferol (VITAMIN D) 1000 units tablet, Take 1,000 Units by mouth daily., Disp: , Rfl:    KRILL OIL PO, Take 1 tablet by mouth 2 (two) times a week. TAKES ON MONDAYS AND THURSDAYS, Disp: , Rfl:    meloxicam (MOBIC) 15 MG tablet, Take 1 tablet (15 mg total) by mouth daily., Disp: 30 tablet, Rfl: 3   Multiple Vitamin (MULTI-VITAMIN) tablet, Take by mouth., Disp: , Rfl:    NEOMYCIN-POLYMYXIN-HYDROCORTISONE (CORTISPORIN) 1 % SOLN OTIC solution, Apply 1-2 drops to toe BID after soaking, Disp: 10 mL, Rfl: 1   omega-3 acid ethyl esters (LOVAZA) 1 g capsule, Take 1 g by mouth daily. , Disp: , Rfl:    oxyCODONE-acetaminophen (PERCOCET) 5-325 MG tablet, Take 1 tablet by mouth every 4 (four) hours as needed for severe pain (pain score 7-10)., Disp: 30 tablet, Rfl: 0   tadalafil (CIALIS) 5 MG tablet, TAKE 1 TABLET BY MOUTH AS NEEDED FOR ED, Disp: 30 tablet, Rfl: 11   vitamin E 400 UNIT capsule, Take 400 Units by mouth daily., Disp: , Rfl:    ondansetron (ZOFRAN) 4 MG tablet, Take 1 tablet (4 mg total) by mouth every  8 (eight) hours as needed. (Patient not taking: Reported on 09/07/2023), Disp: 20 tablet, Rfl: 0  Social History   Tobacco Use  Smoking Status Former   Types: Cigars   Quit date: 12/13/2012   Years since quitting: 10.7  Smokeless Tobacco Never  Tobacco Comments   1 cigar daily    No Known Allergies Objective:   Vitals:   09/07/23 0921  BP: 138/80  Pulse: 63   There is no height or weight on file to calculate BMI. Constitutional Well developed. Well nourished.  Vascular Foot warm and well perfused. Capillary refill normal to all digits.   Neurologic Normal speech. Oriented to person, place, and time. Epicritic sensation to light touch grossly present bilaterally.  Dermatologic Skin healing well without signs of infection. Skin edges well coapted without signs of infection.  Orthopedic: Tenderness to palpation noted about the surgical site.   Radiographs: 3 views of skeletally mature adult left foot: Good correction alignment noted reduction of deformity is noted.  Hardware is intact no signs of backing or loosening Assessment:   1. Status post surgery    Plan:  Patient was evaluated and treated and all questions answered.  S/p foot surgery left -Progressing as expected post-operatively. -XR: See above -WB Status: Partial weightbearing to the heel in  cam boot -Sutures: Intact no signs of Deis is noted no complication noted. -Medications: None -Foot redressed.  No follow-ups on file.

## 2023-09-20 ENCOUNTER — Ambulatory Visit (INDEPENDENT_AMBULATORY_CARE_PROVIDER_SITE_OTHER): Payer: BC Managed Care – PPO | Admitting: Podiatry

## 2023-09-20 DIAGNOSIS — M2042 Other hammer toe(s) (acquired), left foot: Secondary | ICD-10-CM

## 2023-09-20 DIAGNOSIS — Z9889 Other specified postprocedural states: Secondary | ICD-10-CM

## 2023-09-20 DIAGNOSIS — M2012 Hallux valgus (acquired), left foot: Secondary | ICD-10-CM

## 2023-09-20 NOTE — Progress Notes (Signed)
He presents today for his second postop visit date of surgery 09/01/2023.  Left foot Austin bunionectomy with screw fixation and second metatarsal osteotomy with screw fixation and hammertoe repair with pin fixation.  He states that he is doing great but my back has been hurting worse than my foot.  He denies fever chills nausea vomit muscle aches pains calf pain back pain chest pain shortness of breath.  The only back pain was worse at right after surgery and has slowly improved but still tender.  Objective: Vital signs are stable he is alert and oriented x 3 left foot demonstrates dry sterile dressing intact once removed demonstrates dry scaly skin sutures are intact margins well coapted I removed all of the sutures today margins remain well coapted there does not appear to be any skin breakdown at this point other than a little raw spot on the medial aspect of the second digit where he was rubbing the hallux or due to previous dressing.  Assessment: Well-healing surgical foot.  Plan: Follow-up with him in 1 to 2 weeks may consider removing the pin.  X-rays will be not need to be done at the next visit.

## 2023-10-10 ENCOUNTER — Ambulatory Visit (INDEPENDENT_AMBULATORY_CARE_PROVIDER_SITE_OTHER): Payer: BC Managed Care – PPO

## 2023-10-10 ENCOUNTER — Ambulatory Visit (INDEPENDENT_AMBULATORY_CARE_PROVIDER_SITE_OTHER): Payer: BC Managed Care – PPO | Admitting: Podiatry

## 2023-10-10 ENCOUNTER — Encounter: Payer: Self-pay | Admitting: Podiatry

## 2023-10-10 DIAGNOSIS — Z9889 Other specified postprocedural states: Secondary | ICD-10-CM

## 2023-10-10 DIAGNOSIS — M2042 Other hammer toe(s) (acquired), left foot: Secondary | ICD-10-CM

## 2023-10-10 DIAGNOSIS — M2012 Hallux valgus (acquired), left foot: Secondary | ICD-10-CM

## 2023-10-10 NOTE — Progress Notes (Signed)
He presents today for his second postop visit date of surgery 09/01/2023.  Left foot Austin bunionectomy with screw fixation and second metatarsal osteotomy with screw fixation and hammertoe repair with pin fixation.  He is doing okay his pain is doing much better his foot pain is improving.  Objective: Vital signs are stable he is alert and oriented x 3 left foot demonstrates dry sterile dressing intact once removed demonstrates dry scaly skin sutures are intact margins well coapted I removed all of the sutures today margins remain well coapted there does not appear to be any skin breakdown at this point other than a little raw spot on the medial aspect of the second digit where he was rubbing the hallux or due to previous dressing.  Assessment: Well-healing surgical foot.  Plan: His foot pain is feeling much better.  He has a broken hardware pin was removed.  I discussed with him that the broken pin is pretty well position around the bone and discussed leaving it in place if it ever bothers him we can consider removing it.  However I will have him see Dr. Al Corpus for 1 final follow-up in case if there is different plan.

## 2023-10-25 ENCOUNTER — Encounter: Payer: Self-pay | Admitting: Podiatry

## 2023-10-25 ENCOUNTER — Ambulatory Visit (INDEPENDENT_AMBULATORY_CARE_PROVIDER_SITE_OTHER): Payer: BC Managed Care – PPO | Admitting: Podiatry

## 2023-10-25 ENCOUNTER — Ambulatory Visit (INDEPENDENT_AMBULATORY_CARE_PROVIDER_SITE_OTHER): Payer: BC Managed Care – PPO

## 2023-10-25 DIAGNOSIS — M2042 Other hammer toe(s) (acquired), left foot: Secondary | ICD-10-CM

## 2023-10-25 DIAGNOSIS — M2012 Hallux valgus (acquired), left foot: Secondary | ICD-10-CM

## 2023-10-25 DIAGNOSIS — Z9889 Other specified postprocedural states: Secondary | ICD-10-CM

## 2023-10-25 NOTE — Progress Notes (Signed)
He presents today date of surgery 09/01/2023.  Left foot Austin bunionectomy with screw fixation second metatarsal osteotomy with screw fixation hammertoe repair second with pin or wire fixation.  States that it seems to be doing all right.  Objective: Vital signs are stable he is alert oriented x 3 Limited range of motion of the first metatarsophalangeal joint and swelling of the second toe.  Otherwise he is in no pain.  Radiographs taken today demonstrate well-healing osteotomies retention of a portion of the pin in the second metatarsal head and the 2 screws are intact to the second metatarsal.  Assessment: Well-healing surgical foot moderately edematous.  Plan: He is to go ahead and get back into his regular shoes I did demonstrate to him how to wrap the second toe to help with the swelling he understands this and is amenable to it we will follow-up with him in 1 month for his final set of x-rays.

## 2023-11-22 ENCOUNTER — Encounter: Payer: BC Managed Care – PPO | Admitting: Podiatry

## 2023-12-06 ENCOUNTER — Encounter: Payer: BC Managed Care – PPO | Admitting: Podiatry

## 2023-12-20 ENCOUNTER — Ambulatory Visit (INDEPENDENT_AMBULATORY_CARE_PROVIDER_SITE_OTHER): Payer: BC Managed Care – PPO

## 2023-12-20 ENCOUNTER — Encounter: Payer: Self-pay | Admitting: Podiatry

## 2023-12-20 ENCOUNTER — Ambulatory Visit (INDEPENDENT_AMBULATORY_CARE_PROVIDER_SITE_OTHER): Payer: BC Managed Care – PPO | Admitting: Podiatry

## 2023-12-20 DIAGNOSIS — M2012 Hallux valgus (acquired), left foot: Secondary | ICD-10-CM

## 2023-12-20 DIAGNOSIS — Z9889 Other specified postprocedural states: Secondary | ICD-10-CM

## 2023-12-20 DIAGNOSIS — M2042 Other hammer toe(s) (acquired), left foot: Secondary | ICD-10-CM

## 2023-12-20 NOTE — Progress Notes (Signed)
 He presents today date of surgery September 01, 2023 bunionectomy second metatarsal osteotomy hammertoe repair with pin.  States that he is doing pretty well.  States that it still swells particularly with bad shoes.  Objective: Vital signs are stable he is alert oriented x 3.  Has good range of motion though somewhat limited first metatarsophalangeal joint second metatarsophalangeal joint is good.  Still has swelling and induration around the base of the proximal phalanx of that second toe most likely due to the K wire and/or osteoarthritic change of the head of that joint.  Radiographs taken today demonstrate screws are intact no fractures screws are retained in good position joint space narrowing of the second metatarsal phalangeal joint consistent with either early osteoarthritic change.  Assessment well-healing surgical foot.  Plan: Get back to his regular activity level.  He will notify us  with questions or concerns

## 2024-01-29 ENCOUNTER — Other Ambulatory Visit: Payer: Self-pay

## 2024-01-29 DIAGNOSIS — N401 Enlarged prostate with lower urinary tract symptoms: Secondary | ICD-10-CM

## 2024-01-30 ENCOUNTER — Encounter: Payer: Self-pay | Admitting: Urology

## 2024-01-30 LAB — PSA: Prostate Specific Ag, Serum: 2.5 ng/mL (ref 0.0–4.0)

## 2024-01-31 ENCOUNTER — Encounter: Payer: Self-pay | Admitting: Podiatry

## 2024-07-18 ENCOUNTER — Other Ambulatory Visit: Payer: Self-pay

## 2024-07-18 DIAGNOSIS — N401 Enlarged prostate with lower urinary tract symptoms: Secondary | ICD-10-CM

## 2024-07-22 ENCOUNTER — Other Ambulatory Visit: Payer: Self-pay

## 2024-07-26 ENCOUNTER — Ambulatory Visit: Payer: Self-pay | Admitting: Urology

## 2024-07-30 ENCOUNTER — Encounter: Payer: Self-pay | Admitting: Urology

## 2024-07-30 ENCOUNTER — Ambulatory Visit (INDEPENDENT_AMBULATORY_CARE_PROVIDER_SITE_OTHER): Admitting: Urology

## 2024-07-30 VITALS — BP 119/81 | HR 64 | Ht 72.0 in | Wt 230.0 lb

## 2024-07-30 DIAGNOSIS — N401 Enlarged prostate with lower urinary tract symptoms: Secondary | ICD-10-CM | POA: Diagnosis not present

## 2024-07-30 DIAGNOSIS — N5201 Erectile dysfunction due to arterial insufficiency: Secondary | ICD-10-CM | POA: Diagnosis not present

## 2024-07-30 DIAGNOSIS — Z125 Encounter for screening for malignant neoplasm of prostate: Secondary | ICD-10-CM | POA: Diagnosis not present

## 2024-07-30 MED ORDER — TADALAFIL 5 MG PO TABS
ORAL_TABLET | ORAL | 0 refills | Status: AC
Start: 2024-07-30 — End: ?

## 2024-07-30 NOTE — Progress Notes (Signed)
 07/30/2024 7:48 AM   Nathan Herman 1960-12-25 [id number]  Referring provider: Epifanio Alm SQUIBB, MD [address] [place],  [place] [address]  Chief Complaint  Patient presents with   Elevated PSA   Urologic history: 1.  BPH with LUTS Mild lower urinary tract symptoms No medical management   2.  Erectile dysfunction PDE 5 inhibitor therapy Tadalafil    HPI: Nathan Herman is a 63 y.o. male presents for annual follow-up.  No problems since last visit. No bothersome lower urinary tract symptoms. Tadalafil  5 mg effective for ED Slight increase PSA last year to 3.0 with repeat PSA March 2025 back to baseline 2.5 No dysuria, gross hematuria No flank, abdominal or pelvic pain   PMH: Past Medical History:  Diagnosis Date   Arthritis    big toe   History of kidney stones     Surgical History: Past Surgical History:  Procedure Laterality Date   HERNIA REPAIR     umbilical   SHOULDER ARTHROSCOPY WITH OPEN ROTATOR CUFF REPAIR Right 12/19/2017   Procedure: SHOULDER ARTHROSCOPY WITH OPEN ROTATOR CUFF REPAIR;  Surgeon: Edie Norleen PARAS, MD;  Location: [facility];  Service: Orthopedics;  Laterality: Right;   SHOULDER ARTHROSCOPY WITH SUBACROMIAL DECOMPRESSION AND BICEP TENDON REPAIR Left 11/20/2018   Procedure: SHOULDER ARTHROSCOPY WITH DEBRIDEMENT, DECOMPRESSION VERSUS REPAIR OF ROTATOR CUFF TEAR AND EXCISION OF BENIGN SOFT TISSUE MASS IN THE ANTERIOLATERAL ASPECT OF LEFT SHOULDER;  Surgeon: Edie Norleen PARAS, MD;  Location: [facility];  Service: Orthopedics;  Laterality: Left;    Home Medications:  Allergies as of 07/30/2024   No Known Allergies      Medication List        Accurate as of July 30, 2024  7:48 AM. If you have any questions, ask your nurse or doctor.          STOP taking these medications    cholecalciferol 1000 units tablet Commonly known as: VITAMIN D Stopped by: Nathan Herman   omega-3 acid ethyl esters 1 g capsule Commonly known as:  LOVAZA Stopped by: Nathan Herman   vitamin E 180 MG (400 UNITS) capsule Stopped by: Nathan Herman       TAKE these medications    KRILL OIL PO Take 1 tablet by mouth 2 (two) times a week. TAKES ON MONDAYS AND THURSDAYS   Multi-Vitamin tablet Take by mouth.   tadalafil  5 MG tablet Commonly known as: CIALIS  TAKE 1 TABLET BY MOUTH AS NEEDED FOR ED        Allergies: No Known Allergies  Family History: History reviewed. No pertinent family history.  Social History:  reports that he quit smoking about 11 years ago. His smoking use included cigars. He has never used smokeless tobacco. He reports that he does not drink alcohol and does not use drugs.   Physical Exam: BP 119/81   Pulse 64   Ht 6' (1.829 m)   Wt 230 lb (104.3 kg)   BMI 31.19 kg/m   Constitutional:  Alert, No acute distress. HEENT: [place] AT Respiratory: Normal respiratory effort, no increased work of breathing. GU: Patient deferred DRE Psychiatric: Normal mood and affect.   Assessment & Plan:    1.  Erectile dysfunction Stable Tadalafil  refilled  2.  BPH with LUTS No bothersome voiding symptoms  3.  Prostate cancer screening Lab visit for PSA March 2026 schedule   Nathan JAYSON Barba, MD  [facility] [place] [address], [address] [place],  [place] [address] [phone]

## 2024-08-01 ENCOUNTER — Ambulatory Visit: Payer: Self-pay | Admitting: General Surgery

## 2024-08-07 ENCOUNTER — Inpatient Hospital Stay: Admission: RE | Admit: 2024-08-07 | Source: Ambulatory Visit

## 2024-08-07 HISTORY — DX: Pure hypercholesterolemia, unspecified: E78.00

## 2024-08-07 HISTORY — DX: Hallux valgus (acquired), left foot: M20.12

## 2024-08-07 HISTORY — DX: Erectile dysfunction due to arterial insufficiency: N52.01

## 2024-08-07 HISTORY — DX: Essential (primary) hypertension: I10

## 2024-08-07 HISTORY — DX: Other hammer toe(s) (acquired), left foot: M20.42

## 2024-08-07 HISTORY — DX: Elevated prostate specific antigen (PSA): R97.20

## 2024-08-07 HISTORY — DX: Other symptoms and signs involving the musculoskeletal system: R29.898

## 2024-08-07 HISTORY — DX: Other shoulder lesions, right shoulder: M75.81

## 2024-08-14 ENCOUNTER — Ambulatory Visit: Admission: RE | Admit: 2024-08-14 | Source: Home / Self Care | Admitting: General Surgery

## 2024-08-14 ENCOUNTER — Encounter: Admission: RE | Payer: Self-pay | Source: Home / Self Care

## 2024-08-14 SURGERY — EXCISION, MASS, CHEST WALL
Anesthesia: General | Site: Thigh | Laterality: Right

## 2025-01-27 ENCOUNTER — Other Ambulatory Visit

## 2025-07-30 ENCOUNTER — Ambulatory Visit: Admitting: Urology
